# Patient Record
Sex: Male | Born: 1945 | Race: White | Hispanic: No | Marital: Married | Smoking: Former smoker
Health system: Southern US, Community
[De-identification: ages and names within clinical notes are randomized; demographics above are authoritative.]

## PROBLEM LIST (undated history)

## (undated) DIAGNOSIS — G4733 Obstructive sleep apnea (adult) (pediatric): Secondary | ICD-10-CM

## (undated) DIAGNOSIS — E1165 Type 2 diabetes mellitus with hyperglycemia: Secondary | ICD-10-CM

## (undated) DIAGNOSIS — E119 Type 2 diabetes mellitus without complications: Secondary | ICD-10-CM

## (undated) DIAGNOSIS — I480 Paroxysmal atrial fibrillation: Secondary | ICD-10-CM

## (undated) DIAGNOSIS — I2510 Atherosclerotic heart disease of native coronary artery without angina pectoris: Secondary | ICD-10-CM

## (undated) DIAGNOSIS — I10 Essential (primary) hypertension: Secondary | ICD-10-CM

## (undated) DIAGNOSIS — E1140 Type 2 diabetes mellitus with diabetic neuropathy, unspecified: Secondary | ICD-10-CM

## (undated) DIAGNOSIS — E785 Hyperlipidemia, unspecified: Secondary | ICD-10-CM

## (undated) DIAGNOSIS — I25810 Atherosclerosis of coronary artery bypass graft(s) without angina pectoris: Secondary | ICD-10-CM

## (undated) DIAGNOSIS — I499 Cardiac arrhythmia, unspecified: Secondary | ICD-10-CM

## (undated) DIAGNOSIS — K635 Polyp of colon: Secondary | ICD-10-CM

## (undated) HISTORY — DX: Type 2 diabetes mellitus without complications: E11.9

## (undated) HISTORY — DX: Essential (primary) hypertension: I10

## (undated) HISTORY — DX: Cardiac arrhythmia, unspecified: I49.9

## (undated) HISTORY — DX: Hyperlipidemia, unspecified: E78.5

## (undated) HISTORY — DX: Type 2 diabetes mellitus with diabetic neuropathy, unspecified: E11.40

## (undated) HISTORY — DX: Paroxysmal atrial fibrillation: I48.0

## (undated) HISTORY — DX: Polyp of colon: K63.5

## (undated) HISTORY — DX: Atherosclerosis of coronary artery bypass graft(s) without angina pectoris: I25.810

## (undated) HISTORY — DX: Morbid (severe) obesity due to excess calories: E66.01

## (undated) HISTORY — DX: Atherosclerotic heart disease of native coronary artery without angina pectoris: I25.10

## (undated) HISTORY — DX: Type 2 diabetes mellitus with hyperglycemia: E11.65

## (undated) HISTORY — DX: Obstructive sleep apnea (adult) (pediatric): G47.33

---

## 2000-04-07 ENCOUNTER — Encounter: Admission: RE | Admit: 2000-04-07 | Discharge: 2000-07-06 | Payer: Self-pay | Admitting: Internal Medicine

## 2000-05-25 ENCOUNTER — Inpatient Hospital Stay (HOSPITAL_COMMUNITY): Admission: EM | Admit: 2000-05-25 | Discharge: 2000-06-03 | Payer: Self-pay | Admitting: Emergency Medicine

## 2000-05-25 ENCOUNTER — Encounter: Payer: Self-pay | Admitting: Emergency Medicine

## 2000-05-27 ENCOUNTER — Encounter: Payer: Self-pay | Admitting: Interventional Cardiology

## 2000-05-28 ENCOUNTER — Encounter: Payer: Self-pay | Admitting: Cardiothoracic Surgery

## 2000-05-29 ENCOUNTER — Encounter: Payer: Self-pay | Admitting: Cardiothoracic Surgery

## 2000-05-30 ENCOUNTER — Encounter: Payer: Self-pay | Admitting: Cardiothoracic Surgery

## 2000-05-31 ENCOUNTER — Encounter: Payer: Self-pay | Admitting: Cardiothoracic Surgery

## 2000-08-06 ENCOUNTER — Inpatient Hospital Stay (HOSPITAL_COMMUNITY): Admission: AD | Admit: 2000-08-06 | Discharge: 2000-08-08 | Payer: Self-pay | Admitting: Interventional Cardiology

## 2002-04-08 ENCOUNTER — Ambulatory Visit (HOSPITAL_COMMUNITY): Admission: RE | Admit: 2002-04-08 | Discharge: 2002-04-08 | Payer: Self-pay | Admitting: Gastroenterology

## 2002-04-08 ENCOUNTER — Encounter (INDEPENDENT_AMBULATORY_CARE_PROVIDER_SITE_OTHER): Payer: Self-pay | Admitting: Specialist

## 2003-05-15 ENCOUNTER — Encounter: Admission: RE | Admit: 2003-05-15 | Discharge: 2003-05-15 | Payer: Self-pay | Admitting: Internal Medicine

## 2003-06-13 ENCOUNTER — Ambulatory Visit (HOSPITAL_COMMUNITY): Admission: RE | Admit: 2003-06-13 | Discharge: 2003-06-14 | Payer: Self-pay | Admitting: Neurosurgery

## 2003-07-28 ENCOUNTER — Encounter: Admission: RE | Admit: 2003-07-28 | Discharge: 2003-07-28 | Payer: Self-pay | Admitting: Neurosurgery

## 2009-11-02 ENCOUNTER — Ambulatory Visit (HOSPITAL_COMMUNITY): Admission: RE | Admit: 2009-11-02 | Discharge: 2009-11-02 | Payer: Self-pay | Admitting: Gastroenterology

## 2010-04-01 ENCOUNTER — Encounter: Payer: Self-pay | Admitting: Internal Medicine

## 2010-07-27 NOTE — H&P (Signed)
[place]. [facility]  Patient:    [name], [name]                       MRN: [id number] Adm. Date:  05/25/00 Attending:  Darci Needle, M.D. CC:         Winn Jock. Earl Gala, M.D.   History and Physical  INDICATION FOR ADMISSION:  Left shoulder discomfort, suspicion of unstable angina.  SUBJECTIVE:  Mr. Tignor is 65 years of age and has a history of coronary artery disease.  He suffered a myocardial infarction on his inferior wall in 1995 and underwent right coronary stent placement on November 09, 1993.  He has been asymptomatic since that time.  His presentation at that time was left shoulder discomfort.  Starting last evening, he began experiencing left shoulder discomfort and arm discomfort and a very similar distribution of that associated with his acute event in 1995.  He tried Tylenol last night.  Discomfort was mild, but persistent pretty much through the night.  This morning he took one sublingual nitroglycerin and discomfort abated within 10 minutes of the tablet melting. I instructed him to come to the emergency room.  There were no associated symptoms.  He specifically denies nausea, vomiting, diaphoresis, and chest discomfort.  He has had no dyspnea.  PAST MEDICAL HISTORY: 1. Inferior infarction.  Right coronary stent September 1995. 2. Hypertension greater than 15 years. 3. Diabetes mellitus greater than 10 years.  HABITS:  Negative tobacco use.  Discontinued smoking in 1995.  Positive ETOH socially.  He does not drink on a daily basis.  MEDICATIONS: 1. _________ 500 mg two tablets b.i.d. 2. Avandia 8 mg per day. 3. Metoprolol 25 mg b.i.d. 4. Zocor 20 mg per day. 5. Altace 5 mg per day. 6. Aspirin 325 mg per day.  ALLERGIES:  MORPHINE SULFATE causes skin rash.  FAMILY HISTORY:  Father is alive, age 61, has dementia and diabetes.  Mother died of myocardial infarction.  He has an older brother who is 18 and has had coronary  bypass surgery.  REVIEW OF SYSTEMS:  No GU, GI symptoms.  No neurological symptoms.  Denies skin rash.  Denies headache.  Does not have back discomfort.  No hemoptysis, coughing, or dyspnea.  Denies lower extremity swelling.  PHYSICAL EXAMINATION:  GENERAL:  On exam patient is in no distress.  SKIN:  Warm and dry.  No nail bed cyanosis was noted.  VITAL SIGNS:  Respirations are 16 and nonlabored.  Heart rate is 74, blood pressure is 124/66.  HEENT:  Pupils are equal and reactive.  No xanthelasma.  Extraocular movements are full.  NECK:  No JVD, carotid bruits, thyromegaly, or adenopathy.  CHEST:  Clear to auscultation and percussion.  CARDIAC:  No clicks, rubs, murmurs, or gallops.  ABDOMEN:  Soft.  Liver and spleen are not palpable.  Bowel sounds are normal.  EXTREMITIES:  Reveal no edema.  Femoral pulses are 2+ and symmetric.  Dorsalis pedis is 2+ bilaterally.  NEUROLOGIC:  Does not reveal any motor deficits.  Cranial nerves intact.  LABORATORY:  EKG reveals normal sinus rhythm, inferior MI, old.  No acute ST/T change.  Chest x-ray:  Cardiac enlargement with no CHF.  Labs reveal hemoglobin of 13.2.  CK-MB and troponin I are pending.  I stat reveals potassium 4.1, creatinine 0.8.  Hemoglobin 14.  IMPRESSION: 72. A 65 year old gentleman admitted with left shoulder discomfort relieved    with sublingual nitroglycerin.  This  is the same presentation as he had in    1995 when he had an inferior infarction and required right coronary stent.    He is admitted to rule out myocardial infarction.  He is suspected of    having an acute coronary syndrome. 2. Diabetes mellitus. 3. Hypertension.  PLAN: 1. Admit. 2. Subcu Lovenox. 3. IV nitroglycerin. 4. Aspirin and Plavix. 5. Coronary angiography. DD:  05/25/00 TD:  05/25/00 Job: [id number]

## 2010-07-27 NOTE — Cardiovascular Report (Signed)
[place]. [facility]  Patient:    [name], [name]                     MRN: [id number] Proc. Date: 05/26/00 Adm. Date:  [id number] Attending:  Molpus, Carlisle Beers CC:         Theressa Millard, M.D.  CVTS   Cardiac Catheterization  INDICATION FOR PROCEDURE:  Recent chest discomfort and trace positive cardiac markers for myocardial injury qualifying the patient as an acute coronary syndrome.  He has a prior history of inferior infarction with coronary stent in the mid RCA, November 09, 1993.  PROCEDURE PERFORMED: 1. Left heart catheterization. 2. Selective coronary angiogram. 3. Left ventriculography.  CARDIOLOGIST:  Darci Needle III, M.D.  DESCRIPTION OF PROCEDURE:  After informed consent a 6 French sheath was inserted into the right femoral artery using the modified Seldinger technique.  A 6 French A-2 multipurpose catheter was used for hemodynamic recordings and left ventriculography by hand injection in both the RAO, 30-degree cranial and 45-degree LAO.  Selective coronary angiography was performed using this catheter as well.  The patient tolerated the procedure without complications.  RESULTS: I - HEMODYNAMIC DATA: a) Aortic pressure 120/82. b) Left ventricular pressure 120/16.  II - LEFT VENTRICULOGRAPHY:  The left ventricle was mildly dilated.  The anteroapical region is mildly hypokinetic.  The inferior wall is moderately hypokinetic.  No mitral regurgitation is noted.  EF is estimated to be in the 45-55% range.  III - SELECTIVE CORONARY ANGIOGRAPHY: a) Left Main Coronary:  Widely patent.  b) Left Anterior Coronary Descending:  LAD is a large vessel that is heavily calcified as noted by cine fluoroscopy.  Its territory is relatively small. Gives origin to a moderate-sized diagonal.  The distal LAD stops short of the left ventricular apex.  The proximal and mid portion of the LAD is severely and diffusely diseased with up to 80%  narrowing and there is 80-90% segmental narrowing in the first diagonal.  Both regions are heavily calcified.  c) Circumflex Artery:  The circumflex artery is large.  It gives origin to five obtuse marginal branches.  The first obtuse marginal rises very proximally and contains 70-80% ostial narrowing.  The second obtuse marginal is very large and bifurcates on the left lateral wall.  It contains a proximal eccentric 40% narrowing.  The proximal circumflex as it rises from the left main contains 30% narrowing.  The circumflex beyond the second obtuse marginal contains segmental 50-70% narrowing.  The third, fourth and fifth obtuse marginal branches are free of significant obstruction.  d) Right Coronary:  The right coronary artery is the site of previous PCI. There is an eccentric 65-75% stenosis in the proximal portion of the mid vessel.  Between the two acute marginal branches there is an 80-90% stenosis. This is the region previously stented and therefore this does qualify as in-stent restenosis.  The distal RCA contains an eccentric 90% stenosis before a large PDA.  The PDA is large and wraps around the left ventricular apex. The continuation of the right coronary contains a 50% stenosis before it terminates on two relatively large left ventricular branches.  CONCLUSIONS: 1. Severe left anterior descending and right coronary disease.  The right coronary also includes in-stent restenosis.  The distal right coronary bed is very large and graftable.  The LAD territory is relatively small.  There is diffuse heavy calcification in the proximal and mid LAD, and the large first diagonal.  The distal vessels are probably easily graftable. The circumflex only contains moderate disease with the most severe area of narrowing occurring after the large second obtuse marginal branch.  The only distal branch that is graftable is the fourth obtuse marginal.  2. Left ventricular  dysfunction.  PLAN:  CVTS evaluation for consideration of surgery versus PCI with stenting and brachytherapy of the right coronary, and eventual rotational atherectomy of the LAD diagonal system. DD:  05/26/00 TD:  05/27/00 Job: [id number]

## 2010-07-27 NOTE — Op Note (Signed)
[place]. [facility]  Patient:    [name]                     MRN: [id number] Proc. Date: 05/28/00 Adm. Date:  [id number] Attending:  Mikey Bussing CC:         Celso Sickle, M.D., [facility]   Operative Report  PREOPERATIVE DIAGNOSES:  Class IV progressive angina with severe three-vessel coronary disease, non-Q-wave myocardial infarction.  POSTOPERATIVE DIAGNOSES:  Class IV progressive angina with severe three-vessel coronary disease, non-Q-wave myocardial infarction.  PROCEDURE:  Coronary artery bypass graft x 4 (left internal mammary artery o the left anterior descending coronary artery, left radial artery graft to posterior descending, saphenous vein graft to diagonal, saphenous vein graft to obtuse marginal 2).  SURGEON:  Mikey Bussing, M.D.  ASSISTANT:  Lissa Merlin, P.A.-C.  ANESTHESIA:  General by Burna Forts, M.D.  INDICATIONS:  The patient is a 65 year old diabetic with known coronary artery disease, who presents with acute-onset chest pain and was admitted and ruled in for non-Q-wave MI.  Cardiac catheterization by Dr. Verdis Prime demonstrated severe three-vessel coronary artery disease with a high-grade 95% stenosis of the right coronary artery, 80% calcific stenosis of the LAD, and a 70-80% stenosis of the OM2.  He was referred for surgical coronary revascularization. Prior to the operation, I examined the patient in his hospital room and reviewed the results of the cardiac catheterization with the patient and his family.  I discussed the indications and expected benefits of coronary bypass surgery.  I reviewed the choice of conduit to include mammary artery and left radial artery as well as saphenous vein.  I discussed the location of the surgical incisions, the use of general anesthesia, the use of cardiopulmonary bypass, and the expected hospital recovery period.  I discussed the alternatives to  surgery for his coronary artery disease.  I reviewed the risks associated with a coronary bypass operation with the patient, including the risks of MI, CVA, bleeding, infection, and death.  He understood these implications for surgery and agreed to proceed with the operation as planned under informed consent.  OPERATIVE FINDINGS:  The patients body habitus made exposure of the back of the heart difficult.  The OM2 was a 1.5 mm vessel and adequate quality for grafting.  The posterior descending was a large 1.8-2.0 mm vessel.  The second diagonal was a 1.5 mm vessel, and the LAD was a 1.8 mm vessel that did not fully reach the apex of the left ventricle.  The saphenous vein was harvested from the left leg and was of average quality.  DESCRIPTION OF PROCEDURE:  The patient was brought to the operating room and placed supine on the operating table, where general anesthesia was induced under invasive hemodynamic monitoring.  The chest, abdomen, and legs were prepped as well as the left arm and draped as a sterile field.  First the left radial artery was harvested as a free graft from the left arm.  Prior to removing the graft, a Doppler pulse was documented in the hand after occlusion of the proximal radial artery with a vascular clamp.  The incision was then closed, and the arm was partially brought back to the patients side.  At this point, the mammary artery was harvested following sternotomy, and saphenous vein was harvested from the left leg.  Heparin was administered, and the ACT was documented to be therapeutic.  Sternal retractor  was placed using the deep blade.  The pursestring was placed in the ascending aorta and right atrium. The patient was cannulated and placed on bypass.  The coronaries were identified, and the mammary artery, radial artery, and veins were prepared for the distal anastomoses.  A cardioplegia cannula was placed in the ascending aorta, and the patient was cooled  to 28 degrees.  As the aortic crossclamp was applied, 600 cc of cold blood cardioplegia was delivered to the aortic root with immediate cardioplegic arrest and septal temperature dropping to less than 12 degrees.  Topical ice saline slush was used to augment myocardial preservation, and a pericardial insulator pad was used to protect the left phrenic nerve.  The distal coronary anastomoses were then performed.  The first distal anastomosis was to the diagonal.  This was a 1.5 mm vessel with a proximal 90% stenosis, and a reversed saphenous vein was sewn end-to-side with a running 7-0 Prolene with good flow through the graft.  The second distal anastomosis was to the OM2.  This was a 1.5 mm vessel with proximal 80% stenosis, and a reversed saphenous vein was sewn end-to-side with a running 7-0 Prolene with good flow through the graft.  The third distal anastomosis was the left free radial artery graft to the posterior descending, which was a 2.0 mm vessel with proximal 95% stenosis.  An end-to-side anastomosis using running 8-0 Prolene was constructed, and there was good flow through the graft.  The fourth distal anastomosis was to the mid-LAD, which was a 1.8 mm vessel with proximal 80% stenosis.  The left internal mammary artery pedicle was brought through an opening created in the left lateral pericardium and was brought down on the LAD and sewn end-to-side with running 8-0 Prolene.  There was excellent flow through the anastomosis with immediate rise in septal temperature after release of the pedicle clamp on the mammary artery.  The mammary pedicle was secured to the epicardium, and the aortic crossclamp was removed.  The heart was cardioverted back to a regular rhythm.  A partial occlusion clamp was placed on the ascending aorta, and three proximal anastomoses were performed with the OM vein being superior and the radial artery graft being inferior on the aorta.  The partial clamp  was removed, and the grafts were perfused.  Each had excellent flow, and hemostasis was documented on the  proximal and distal sites.  The patient was rewarmed and reperfused, and temporary pacing wires were applied.  When the patient reached 37 degrees, the lungs were re-expanded and the ventilator was turned back on.  The patient was weaned from cardiopulmonary bypass without difficulty, without inotropes, and good hemodynamics and blood pressure.  Protamine was administered, and the cannulas were removed.  The mediastinum was irrigated with warm antibiotic irrigation.  The leg incision was irrigated and closed in the standard fashion.  The pericardium was loosely reapproximated superiorly over the aorta and grafts.  Two mediastinal and a left pleural chest tube were placed, brought out through separate incisions.  The sternum was reapproximated with interrupted steel wire.  The pectoralis fascia was closed with interrupted #1 Vicryl.  The subcutaneous layer was closed with a running 2-0 Vicryl.  Skin was closed with a subcuticular.  Sterile dressings were applied.  Total cardiopulmonary bypass time was 120 minutes with aortic crossclamp time of 55 minutes. DD:  05/28/00 TD:  05/29/00 Job: [id number]

## 2010-07-27 NOTE — Consult Note (Signed)
[place]. [facility]  Patient:    [name], [name]                     MRN: [id number] Proc. Date: 05/26/00 Adm. Date:  [id number] Attending:  Molpus, Carlisle Beers CC:         [facility]  CVTS office   Consultation Report  REQUESTING PHYSICIAN:  Darci Needle, M.D.  PRIMARY CARE PHYSICIAN:  Winn Jock. Earl Gala, M.D.  REASON FOR CONSULTATION:  Unstable angina with severe three vessel coronary disease.  CHIEF COMPLAINT:  Left shoulder pain.  HISTORY OF PRESENT ILLNESS:  I was asked to see Mr. Tai Skelly in consultation by Dr. Katrinka Blazing for evaluation of his severe three vessel coronary disease for possible surgical coronary revascularization.  The patient was admitted to the hospital through the emergency department on May 25, 2000, after developing left shoulder pain.  He described the pain as severe and pressure like and at first he thought it might be due to extensive yard work he did over the weekend.  The pain did not resolve with rest but he did take nitroglycerin with some relief and for that reason he then presented to the emergency department following the recommendation of Dr. Verdis Prime.  The patient had no associated shortness of breath, diaphoresis or nausea.  There is no neck or jaw pain.  The patient had been pain free since 1995 when he had a DMI and was treated with a right coronary stent by Dr. Verdis Prime.  He had been followed at regular intervals by Dr. Theressa Millard for his medical problems including hypertension and diabetes and had no interval cardiac symptoms since 1995.  He was admitted through the emergency department.  He had a mild rise in cardiac enzymes with a CPK-MB of 6.2.  He was stabilized on Lovenox and nitroglycerin.  He underwent cardiac catheterization today by Dr. Katrinka Blazing which showed a restenosis of the right coronary with a 90% distal RCA stenosis prior to the posterior descending and 70-80% stenosis to  the proximal LAD diagonal and 70% stenosis of the circumflex marginal with ejection fraction of 50% and left ventricular end-diastolic pressure measured at 16 mmHg.  Because of his type 2 diabetes and severe three vessel disease with progression over the past several years he was referred for surgical coronary revascularization.  PAST MEDICAL HISTORY: 1. Type 2 diabetes mellitus, on oral medications for 10 years. 2. Hypertension. 3. Known coronary artery disease. 4. Status post right lower leg injury from a chainsaw several years ago.  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  He has not smoked since 1995. He uses alcohol occasionally.  He lives with his wife in [place].  MEDICATIONS: 1. Avandia 8 mg q.d. 2. Glucovance 1 g p.o. b.i.d. 3. Metoprolol 25 mg b.i.d. 4. Zocor 20 mg q.d. 5. Altace 5 mg q.d. 6. Aspirin 325 mg q.d.  ALLERGIES:  Morphine and sulfate.  FAMILY HISTORY:  Brother age 49 had bypass surgery.  Mother who has died of an MI had previous bypass surgery and father age 31 is alive with diabetes.  REVIEW OF SYSTEMS:  He has lost 15 pounds in weight intentionally in order to help control his diabetes.  He denies any fever, productive cough or respiratory symptoms in the recent several days.  He denies any history of TIA or CVA.  He  denies any history of claudication of DVT.  He denies any history of free bleeding or easy bruisability.  He denies any history of thoracic trauma or broken ribs.  There is no change in bowel habits.  No chronic skin conditions.  Otherwise, review of systems is negative.  He is a right hand dominant individual.  PHYSICAL EXAMINATION:  VITAL SIGNS:  Height 5 feet 9 inches, weight 240 pounds.  Blood pressure is 140/70, heart rate 62 and regular and sinus rhythm.  Oxygen saturation is 94-95% on room air.  GENERAL:  Appearance is that of a very pleasant  middle-age white male in no distress in his hospital room following cardiac catheterization without chest pain.  He has just finished eating supper.  HEENT:  Normocephalic.  Full EOMs.  Pharynx clear.  Dentition under good repair.  NECK:  Supple without JVD, thyromegaly, mass or carotid bruits.  LUNGS:  Clear to auscultation without deformity.  CARDIAC:  Exam reveals regular rate and rhythm without S3 gallop or murmur.  ABDOMEN:  Soft, nontender without mass or abdominal bruit.  EXTREMITIES:  No swollen tender joints, clubbing or edema.  LYMPHATIC:  No palpable adenopathy in the cervical or supraclavicular regions.  VASCULAR:  Exam reveals 2+ pulses in both radials, both femorals and both pedal regions.  There is no evidence of chronic venous insufficiency.  SKIN:  Exam reveals chronic scarred areas in the right medial calf without active inflammation.  RECTAL:  Exam is deferred.  NEUROLOGICAL:  Alert and oriented x 3 with full motor function while he is limited to bed rest following cardiac catheterization.  LABORATORY DATA:  I reviewed the coronary angiograms with Dr. Verdis Prime.  He agreed that with three vessel disease and his underlying diabetes that surgical revascularization would provide him the best long term therapy for his coronary artery disease.  His BUN and creatinine prior to catheterization were normal, and his cardiac enzymes showed a very slight elevation.  IMPRESSION AND PLAN:  Severe three vessel coronary disease, class IV unstable angina, recent non-Q wave myocardial infarction.  We plan on coronary bypass grafting his left IMA to his LAD and vein grafts to the diagonal, circumflex marginal, posterior descending.  Surgery will be scheduled on Wednesday, May 28, 2000.  I have discussed the major aspects of the operation with the patient and his wife as well as the benefits and risks of the procedure. They  are in agreement to proceed with the  operation at this time.  I will return tomorrow to answer any further questions and to review the final plan for surgery on May 28, 2000.  Thank you very much for this consult. DD:  05/26/00 TD:  05/26/00 Job: [id number]

## 2010-07-27 NOTE — Discharge Summary (Signed)
[place]  Patient:    [name], [name]                     MRN: [id number] Adm. Date:  [id number] Disc. Date: [id number] Attending:  Kerin Perna Iii Dictator:   Myrlene Broker, P.A. CC:         Darci Needle, M.D.             [place]. Earl Gala, M.D.             CVTS office                           Discharge Summary  DATE OF BIRTH:  [birth year]  CARDIOLOGIST:  Dr. Verdis Prime, M.D., with [facility].  PRIMARY CARE PHYSICIAN:  Dr. Theressa Millard.  ADMISSION DIAGNOSIS:  Class IV unstable progressive angina with severe three-vessel coronary artery disease, non-Q-wave myocardial infarction.  SECONDARY DIAGNOSES/PREEXISTING CONDITIONS: 1. Inferior myocardial infarction, status post right stent placement in    September 1995. 2. Hypertension. 3. Diabetes mellitus. 4. Allergy to morphine, which causes a rash.  NEW DIAGNOSIS/DISCHARGE DIAGNOSES: 1. Postoperative anemia, which is asymptomatic and resolved. 2. Status post coronary artery bypass graft surgery.  PROCEDURES: 1. On May 26, 2000, the patient underwent cardiac catheterization. 2. On May 26, 2000, the patient had pre-coronary artery bypass graft surgery    Doppler evaluation. 3. On May 28, 2000, the patient had coronary artery bypass graft surgery    x 4, with the following grafts placed:  Left internal mammary artery to the    left anterior descending, saphenous vein graft to the diagonal branch 2,    saphenous vein graft to the obtuse marginal branch 2, and left radial    artery to the posterior descending artery.  HISTORY OF PRESENT ILLNESS AND HOSPITAL COURSE:  The patient is a 65 year old Caucasian male who had a history of coronary artery disease.  He had suffered a previous myocardial infarction of his inferior wall in 1995, and had a right coronary stent placement on November 09, 1993.  He began experiencing left shoulder discomfort and arm discomfort on  May 25, 2000, and was admitted under cardiologys service.  At that time a cardiac catheterization was performed which revealed severe three-vessel coronary artery disease. Dr. Kathlee Nations Trigt was consulted, and it was recommended that coronary artery bypass graft surgery would be the best option for this patient.  The patient underwent surgery on May 26, 2000, and the patient tolerated the procedure well.  His postoperative course was notable for some postoperative anemia for which the patient was asymptomatic and eventually improved.  The patient also had a low-grade fever, for which no etiology was determined.  The patients fever has resolved, and the patient will be sent home afebrile.  The patient did not have any cardiac complications, respiratory compromise, and his blood sugars remained stable throughout his admission.  The patient is anticipated for discharge today on June 03, 2000.  CONDITION ON DISCHARGE:  Stable and improved.  DISCHARGE MEDICATIONS: 1. Avandia 8 mg once daily. 2. Lasix 40 mg daily x 10 days. 3. Potassium chloride 20 mEq daily x 10 days. 4. Lopressor 25 mg b.i.d. 5. Altace 5 mg p.o. q.a.m. 6. Imdur 30 mg a day x 3 weeks for his left radial artery. 7. Enteric-coated aspirin 325 mg once daily. 8. Glucophage 500 mg 2 tablets b.i.d. 9.  Niferex 150 mg tablet b.i.d.  DISCHARGE INSTRUCTIONS:  He is instructed not to do any driving or lifting more than 10 pounds, to walk daily, and continue breathing exercises.  To follow a low fat, low sodium diet.  Told he could shower, clean the wounds with mild soap and water, and call the office if wound problems arise as noted on fact sheet.  FOLLOW-UP:  The patient is going to have a staple removal appointment in one week at the CVTS office, and the patient is to call Dr. Katrinka Blazing, his cardiologist, to make his follow-up appointment to see him in two weeks.  He will have his chest x-ray taken there and bring that chest  x-ray to his appointment with Dr. Kathlee Nations Trigt, who he will see in three weeks.  The patient is to be notified by our office of when that time will be.  The patient is also instructed to call Dr. Earl Gala for a follow-up appointment in approximately one month to six weeks. DD:  06/03/00 TD:  06/03/00 Job: [id number]

## 2010-07-27 NOTE — Op Note (Signed)
NAME:  SHAHIEM, BEDWELL                        ACCOUNT NO.:  [id number]   MEDICAL RECORD NO.:  [id number]                   PATIENT TYPE:  OIB   LOCATION:  2899                                 FACILITY:  [facility]   PHYSICIAN:  Kathaleen Maser. Pool, M.D.                 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   OPERATION PERFORMED:  C5-6 anterior cervical diskectomy and fusion with  allograft and anterior plating.   SURGEON:  Kathaleen Maser. Pool, M.D.   ASSISTANT:  Donalee Citrin, M.D.   ANESTHESIA:  General endotracheal.   INDICATIONS FOR PROCEDURE:  The patient is a 66 year old male with history  of neck and left upper extremity pain, paresthesias and weakness, consistent  with a left-sided C6 radiculopathy with elements of overlying cervical  myelopathy as well.  Work-ups demonstrated evidence of a significant  leftward C5-6 disk herniation with spinal cord compression and compression  of the exiting left-sided C6 nerve root.  The patient has been counseled as  to his options.  He has decided to proceed with C5-6 anterior cervical  diskectomy and fusion, allograft and anterior plating for hopeful  improvement of his symptoms.   DESCRIPTION OF PROCEDURE:  The patient was taken to the operating room and  placed on the table in supine position.  After adequate level of general  anesthesia was achieved, the patient was positioned supine with the neck  slightly extended and held in place with halter traction.  The patient's  anterior cervical region was prepped and draped sterilely.  A 10 blade was  used to make a linear skin incision overlying the C5-6 interspace.  This was  carried down sharply to the platysma.  The platysma was then divided  vertically and dissection proceeded along the medial border of the  sternocleidomastoid muscle and carotid sheath.  Trachea and esophagus were  mobilized and retracted toward the left.  Prevertebral fascia was stripped  off the anterior spinal column.  The longus colli muscles were then elevated  bilaterally using electrocautery.  Deep self-retaining retractor was placed.  Intraoperative fluoroscopy was used and the level was confirmed.  The disk  spaces were then incised with a 15 blade in rectangular fashion.  A wide  disk space cleanout was then achieved using pituitary rongeurs, forward and  backward angled Carlens curets, Kerrison rongeurs and a high speed drill.  All elements of the disk were removed down to the posterior annulus.  Microscope was brought into the field and used throughout the remainder of  the diskectomy.  The remaining aspects of the annulus and osteophytes were  removed using a high speed drill.  The posterior longitudinal ligament was  then elevated and resected in piecemeal fashion using Kerrison rongeurs.  The underlying thecal sac was identified.  A large amount of freely  herniated disk material was encountered off to the left side.  This was  removed using micro nerve hooks and Kerrison rongeurs.  A wide central  decompression was then performed by undercutting the bodies of C5 and C6.  Decompression proceeded out to each neural foramen.  A wide foraminotomy was  then performed along the course of the exiting C6 nerve root bilaterally.  A  blunt probe was passed easily both superiorly and inferiorly out each neural  foramen.  There was no evidence of any residual compression.  There was no  evidence of injury to the thecal sac and nerve roots.  The wound was then  irrigated with antibiotic solution.  Gelfoam was placed topically for  hemostasis which was found to be good.  An 8 mm patellar wedge allograft was  then impacted in place and recessed approximately 1  mm from the anterior  cortical margin.  A 25 mm Atlantis anterior cervical plate had been placed  over the C5 and C6 levels.  This was attached under fluoroscopic guidance  using 14 mm variable angle screws.  All four screws were found to be solidly  within bone, and given a final tightening.  Locking screws were engaged at  both levels.  Final images revealed good position of bone grafts and  hardware at the proper operative level with normal alignment of the spine.  The wound was then irrigated with antibiotic solution.  Gelfoam was placed  topically for hemostasis.  The wound was inspected for hemostasis one final  time and then closed in typical fashion.  Steri-Strips and sterile dressing  were applied.  There were no apparent complications.  The patient tolerated  the procedure well and returned to the recovery room postoperatively.                                               Henry A. Pool, M.D.    HAP/MEDQ  D:  06/13/2003  T:  06/14/2003  Job:  [id number]

## 2012-12-07 ENCOUNTER — Ambulatory Visit: Payer: Medicare Other | Attending: Geriatric Medicine | Admitting: Physical Therapy

## 2012-12-07 DIAGNOSIS — IMO0001 Reserved for inherently not codable concepts without codable children: Secondary | ICD-10-CM | POA: Insufficient documentation

## 2012-12-07 DIAGNOSIS — H8110 Benign paroxysmal vertigo, unspecified ear: Secondary | ICD-10-CM | POA: Insufficient documentation

## 2012-12-14 ENCOUNTER — Ambulatory Visit: Payer: Medicare Other | Attending: Geriatric Medicine | Admitting: Physical Therapy

## 2012-12-14 DIAGNOSIS — H8110 Benign paroxysmal vertigo, unspecified ear: Secondary | ICD-10-CM | POA: Insufficient documentation

## 2012-12-14 DIAGNOSIS — IMO0001 Reserved for inherently not codable concepts without codable children: Secondary | ICD-10-CM | POA: Insufficient documentation

## 2014-01-10 ENCOUNTER — Encounter: Payer: Self-pay | Admitting: Interventional Cardiology

## 2014-01-13 ENCOUNTER — Encounter: Payer: Self-pay | Admitting: Interventional Cardiology

## 2014-01-13 ENCOUNTER — Ambulatory Visit (INDEPENDENT_AMBULATORY_CARE_PROVIDER_SITE_OTHER): Payer: Medicare Other | Admitting: Interventional Cardiology

## 2014-01-13 ENCOUNTER — Other Ambulatory Visit (HOSPITAL_COMMUNITY): Payer: Medicare Other | Admitting: *Deleted

## 2014-01-13 ENCOUNTER — Ambulatory Visit (HOSPITAL_COMMUNITY): Payer: Medicare Other | Attending: Interventional Cardiology | Admitting: Radiology

## 2014-01-13 VITALS — BP 122/60 | HR 53 | Ht 69.0 in | Wt 234.0 lb

## 2014-01-13 DIAGNOSIS — I480 Paroxysmal atrial fibrillation: Secondary | ICD-10-CM

## 2014-01-13 DIAGNOSIS — I4892 Unspecified atrial flutter: Secondary | ICD-10-CM

## 2014-01-13 DIAGNOSIS — E119 Type 2 diabetes mellitus without complications: Secondary | ICD-10-CM | POA: Insufficient documentation

## 2014-01-13 DIAGNOSIS — E669 Obesity, unspecified: Secondary | ICD-10-CM | POA: Insufficient documentation

## 2014-01-13 DIAGNOSIS — E1140 Type 2 diabetes mellitus with diabetic neuropathy, unspecified: Secondary | ICD-10-CM

## 2014-01-13 DIAGNOSIS — IMO0002 Reserved for concepts with insufficient information to code with codable children: Secondary | ICD-10-CM

## 2014-01-13 DIAGNOSIS — E1165 Type 2 diabetes mellitus with hyperglycemia: Secondary | ICD-10-CM

## 2014-01-13 DIAGNOSIS — E785 Hyperlipidemia, unspecified: Secondary | ICD-10-CM | POA: Diagnosis not present

## 2014-01-13 DIAGNOSIS — G4719 Other hypersomnia: Secondary | ICD-10-CM

## 2014-01-13 DIAGNOSIS — I10 Essential (primary) hypertension: Secondary | ICD-10-CM

## 2014-01-13 DIAGNOSIS — R0609 Other forms of dyspnea: Principal | ICD-10-CM

## 2014-01-13 DIAGNOSIS — R251 Tremor, unspecified: Secondary | ICD-10-CM | POA: Insufficient documentation

## 2014-01-13 DIAGNOSIS — I25810 Atherosclerosis of coronary artery bypass graft(s) without angina pectoris: Secondary | ICD-10-CM

## 2014-01-13 HISTORY — DX: Morbid (severe) obesity due to excess calories: E66.01

## 2014-01-13 HISTORY — DX: Essential (primary) hypertension: I10

## 2014-01-13 HISTORY — DX: Reserved for concepts with insufficient information to code with codable children: IMO0002

## 2014-01-13 HISTORY — DX: Atherosclerosis of coronary artery bypass graft(s) without angina pectoris: I25.810

## 2014-01-13 HISTORY — DX: Paroxysmal atrial fibrillation: I48.0

## 2014-01-13 HISTORY — DX: Type 2 diabetes mellitus with diabetic neuropathy, unspecified: E11.40

## 2014-01-13 MED ORDER — PERFLUTREN LIPID MICROSPHERE
2.0000 mL | Freq: Once | INTRAVENOUS | Status: AC
  Administered 2014-01-13: 2 mL via INTRAVENOUS

## 2014-01-13 NOTE — Patient Instructions (Signed)
Your physician recommends that you continue on your current medications as directed. Please refer to the Current Medication list given to you today.  Your physician has requested that you have an echocardiogram. Echocardiography is a painless test that uses sound waves to create images of your heart. It provides your doctor with information about the size and shape of your heart and how well your heart's chambers and valves are working. This procedure takes approximately one hour. There are no restrictions for this procedure.   Your physician has recommended that you wear a holter monitor. Holter monitors are medical devices that record the heart's electrical activity. Doctors most often use these monitors to diagnose arrhythmias. Arrhythmias are problems with the speed or rhythm of the heartbeat. The monitor is a small, portable device. You can wear one while you do your normal daily activities. This is usually used to diagnose what is causing palpitations/syncope (passing out).   Your physician recommends that you schedule a follow-up appointment in December 2015

## 2014-01-13 NOTE — Progress Notes (Signed)
Patient ID: [name], male   DOB: January 16, 1946, 68 y.o.   MRN: [id number]     [address] [place], [place]  [address] Phone: [phone] Fax:  [phone] Date:  01/13/2014   ID:  [name], DOB January 16, 1946, MRN [id number]  PCP:  No primary care provider on file.   ASSESSMENT:  1. New, atrial flutter with controlled rate, unknown duration 2. Dyspnea on exertion and orthopnea, compatible with acute on chronic diastolic heart failure 3. Coronary artery disease, without angina 4. Hypertension, controlled 5. Fatigue, excessive daytime sleepiness, snoring, frequent awakening from sleep all compatible with undiagnosed sleep apnea  PLAN:  1. 2-D Doppler echocardiogram 2. 48-hour Holter monitor 3. Continue anticoagulation therapy 4. Clinical follow-up in 3-4 weeks 5. Obtain laboratory data recently done by Dr. Eula ListenHussain. BNP is high and basic metabolic panel is compatible, we may need to intensify diuretic regimen by switching HCTZ to furosemide   SUBJECTIVE: [name] is a 68 y.o. male who gives a 6-12 month history of fatigue, dyspnea on exertion, orthopnea, excessive daytime sleepiness, and lower extremity swelling. He denies chest pain. He saw Dr. Eula ListenHussain who did an evaluation and have him follow-up here because of concerns about his heart. EKG demonstrated atrial flutter. He has not had chest pain. He has not had syncope. No blood in his urine or stool. No recent diagnosis of anemia.   Wt Readings from Last 3 Encounters:  01/13/14 234 lb (106.142 kg)     Past Medical History  Diagnosis Date  . Diabetes mellitus without complication     type 2   . Hyperlipidemia   . Hypertension   . Arrhythmia     paroxysmal benign(monitoring, PT)  . Colon polyp     2004, 2007, 2011-repat in 2016   . CAD (coronary artery disease)     (MI x 3).post CABG  . CAD (coronary artery disease)     [facility]  . PAF (paroxysmal atrial  fibrillation) 01/13/2014  . Coronary artery disease involving other coronary artery bypass graft without angina pectoris 01/13/2014  . Essential hypertension 01/13/2014  . Type 2 diabetes, uncontrolled, with neuropathy 01/13/2014  . Morbid obesity 01/13/2014    Current Outpatient Prescriptions  Medication Sig Dispense Refill  . amLODipine (NORVASC) 10 MG tablet Take 10 mg by mouth daily.    . hydrochlorothiazide (HYDRODIURIL) 25 MG tablet Take 25 mg by mouth daily.    Marland Kitchen. lisinopril (PRINIVIL,ZESTRIL) 40 MG tablet Take 40 mg by mouth daily.    . metFORMIN (GLUCOPHAGE) 1000 MG tablet Take 1,000 mg by mouth 2 (two) times daily with a meal.    . simvastatin (ZOCOR) 20 MG tablet Take 20 mg by mouth daily. Take half a tablet daily.    . sotalol (BETAPACE) 120 MG tablet Take 120 mg by mouth daily.    . Vitamin D, Ergocalciferol, (DRISDOL) 50000 UNITS CAPS capsule Take 50,000 Units by mouth every 7 (seven) days.    Marland Kitchen. warfarin (COUMADIN) 5 MG tablet Take 5 mg by mouth daily. Take 1.5 tablet on Mondays and Wednesday and one tablet on all other days.     No current facility-administered medications for this visit.    Allergies:    Allergies  Allergen Reactions  . Morphine And Related     Social History:   Does not smoke or drink.  ROS:  Please see the history of present illness.   No transient neurological complaints.  Does have a tremor right hand. Denies syncope. No ascites/abdominal swelling. Denies wheezing and cough. Appetite is been stable. He sleeps a lot during the day.   All other systems reviewed and negative.   OBJECTIVE: VS:  BP 122/60 mmHg  Pulse 53  Ht 5\' 9"  (1.753 m)  Wt 234 lb (106.142 kg)  BMI 34.54 kg/m2 Well nourished, well developed, in no acute distress, obese HEENT: normal Neck: JVD moderate elevation with the patient lying at 30. Carotid bruit absent  Cardiac:  normal S1, S2; RRR; no murmur Lungs:  clear to auscultation bilaterally, no wheezing, rhonchi or rales Abd:  soft, nontender, no hepatomegaly Ext: Edema trace bilateral lower extremity edema. Pulses 2+ Skin: warm and dry Neuro:  CNs 2-12 intact, no focal abnormalities noted  EKG:  Atrial flutter with rate of 53 bpm with evidence of old anterior infarction and left axis deviation.       Signed, Darci NeedleHenry W. B. Algernon Mundie III, MD 01/13/2014 12:06 PM

## 2014-01-13 NOTE — Progress Notes (Signed)
Echocardiogram performed.  

## 2014-01-14 ENCOUNTER — Telehealth: Payer: Self-pay | Admitting: Interventional Cardiology

## 2014-01-14 ENCOUNTER — Telehealth: Payer: Self-pay

## 2014-01-14 DIAGNOSIS — I10 Essential (primary) hypertension: Secondary | ICD-10-CM

## 2014-01-14 MED ORDER — FUROSEMIDE 40 MG PO TABS: 40.0000 mg | ORAL_TABLET | Freq: Every day | ORAL | Status: DC

## 2014-01-14 NOTE — Telephone Encounter (Signed)
-----   Message from Lyn RecordsHenry W Smith III, MD sent at 01/14/2014  1:40 PM EST ----- Echo shows that his heart is weak. Discontinue hydrochlorothiazide, and start furosemide 40 mg daily. Check basic metabolic panel 7 days after making change

## 2014-01-14 NOTE — Telephone Encounter (Signed)
New message     Please leave new presc for furosemide at desk on Monday and they will come by and pick it up.  [facility] and they will need to know this. Also call in presc to local pharmacy as discussed.

## 2014-01-14 NOTE — Telephone Encounter (Signed)
-----   Message from Henry W Smith III, MD sent at 01/14/2014  1:40 PM EST ----- Echo shows that his heart is weak. Discontinue hydrochlorothiazide, and start furosemide 40 mg daily. Check basic metabolic panel 7 days after making change 

## 2014-01-14 NOTE — Telephone Encounter (Signed)
Pt aware of echo results and Dr.Smith instructions. Echo shows that his heart is weak. Discontinue hydrochlorothiazide, and start furosemide 40 mg daily.    Check basic metabolic panel 7 days after making change.Rx sent to pt pharmacy Lab appt sch for 11/11. Pt verbalized understanding.

## 2014-01-17 ENCOUNTER — Other Ambulatory Visit: Payer: Self-pay

## 2014-01-17 MED ORDER — FUROSEMIDE 40 MG PO TABS: 40.0000 mg | ORAL_TABLET | Freq: Every day | ORAL | Status: DC

## 2014-01-19 ENCOUNTER — Other Ambulatory Visit (INDEPENDENT_AMBULATORY_CARE_PROVIDER_SITE_OTHER): Payer: Medicare Other | Admitting: *Deleted

## 2014-01-19 ENCOUNTER — Encounter (INDEPENDENT_AMBULATORY_CARE_PROVIDER_SITE_OTHER): Payer: Medicare Other

## 2014-01-19 ENCOUNTER — Encounter: Payer: Self-pay | Admitting: *Deleted

## 2014-01-19 ENCOUNTER — Other Ambulatory Visit: Payer: Self-pay | Admitting: Cardiovascular Disease

## 2014-01-19 DIAGNOSIS — I10 Essential (primary) hypertension: Secondary | ICD-10-CM

## 2014-01-19 DIAGNOSIS — I4892 Unspecified atrial flutter: Secondary | ICD-10-CM

## 2014-01-19 LAB — BASIC METABOLIC PANEL
BUN: 23 mg/dL (ref 6–23)
CALCIUM: 9.1 mg/dL (ref 8.4–10.5)
CHLORIDE: 104 meq/L (ref 96–112)
CO2: 30 meq/L (ref 19–32)
CREATININE: 1.1 mg/dL (ref 0.4–1.5)
GFR: 69.57 mL/min (ref >60.00)
GLUCOSE: 110 mg/dL — AB (ref 70–99)
POTASSIUM: 4.2 meq/L (ref 3.5–5.1)
Sodium: 144 mEq/L (ref 135–145)

## 2014-01-19 MED ORDER — FUROSEMIDE 40 MG PO TABS: 40.0000 mg | ORAL_TABLET | Freq: Every day | ORAL | Status: AC

## 2014-01-19 NOTE — Progress Notes (Signed)
Patient ID: [name], male   DOB: October 02, 1945, 68 y.o.   MRN: [id number] [facility] 48 hour holter monitor applied to patient.

## 2014-01-24 ENCOUNTER — Telehealth: Payer: Self-pay

## 2014-01-24 NOTE — Telephone Encounter (Signed)
Returned call to DIRECTVDonn @ [facility]. Adv her that we have not received pt holter via fax. Lupita LeashDonna will  Refax now

## 2014-01-24 NOTE — Telephone Encounter (Signed)
New message  Lupita LeashDonna with [facility] called..reports she has faxed over Holter results for this patient via email. Pt is in Afib Flitter. Please call back to discuss..Marland Kitchen

## 2014-01-24 NOTE — Telephone Encounter (Signed)
Pt aware of holter monitor results and Dr.Smith recommendations. -Aflutter Pt adv to d/c Sotalol pt has been complaining of fatigue. Pt adv that Dr.Smith thinks that will improve after stopping sotalol Pt appt with Dr.Smith has been moved up to 02/14/14 @4 :15pm

## 2014-02-10 ENCOUNTER — Encounter: Payer: Self-pay | Admitting: *Deleted

## 2014-02-14 ENCOUNTER — Ambulatory Visit: Payer: Medicare Other | Admitting: Physician Assistant

## 2014-02-14 ENCOUNTER — Encounter: Payer: Self-pay | Admitting: Interventional Cardiology

## 2014-02-14 ENCOUNTER — Ambulatory Visit (INDEPENDENT_AMBULATORY_CARE_PROVIDER_SITE_OTHER): Payer: Medicare Other | Admitting: Interventional Cardiology

## 2014-02-14 VITALS — BP 136/60 | HR 71 | Ht 69.0 in | Wt 231.0 lb

## 2014-02-14 DIAGNOSIS — R001 Bradycardia, unspecified: Secondary | ICD-10-CM

## 2014-02-14 DIAGNOSIS — R0683 Snoring: Secondary | ICD-10-CM

## 2014-02-14 DIAGNOSIS — I5022 Chronic systolic (congestive) heart failure: Secondary | ICD-10-CM | POA: Insufficient documentation

## 2014-02-14 DIAGNOSIS — I10 Essential (primary) hypertension: Secondary | ICD-10-CM

## 2014-02-14 DIAGNOSIS — I25810 Atherosclerosis of coronary artery bypass graft(s) without angina pectoris: Secondary | ICD-10-CM

## 2014-02-14 DIAGNOSIS — I480 Paroxysmal atrial fibrillation: Secondary | ICD-10-CM

## 2014-02-14 DIAGNOSIS — I5023 Acute on chronic systolic (congestive) heart failure: Secondary | ICD-10-CM

## 2014-02-14 NOTE — Progress Notes (Signed)
Patient ID: [name], male   DOB: 17-Dec-1945, 68 y.o.   MRN: [id number]    [address] [place], [place]  [address] Phone: [phone] Fax:  [phone]) [phone]  Date:  02/14/2014   ID:  [name], DOB 17-Dec-1945, MRN [id number]  PCP:  No primary care provider on file.   ASSESSMENT:  1.  Acute on chronic systolic heart failure, improved with loop diuretic, furosemide  2. Persistent/chronic atrial fibrillation of unknown duration. Had previously been on sotalol for A. Fib suppression. Medication was discontinued after A. Fib was documented to be persistent  3. Heart artery disease with prior coronary bypass grafting rated than 10 years ago  4. Hypertension, stable  PLAN:  1.  Sleep study  2. Left and right heart catheterization with coronary angiography and bypass graft angiography  3. Hold Coumadin for 5 days prior to the procedure  4. If bypass graft failure is not the cause of decrease in LV function, we will return out attention towards the possibility of atrial fibrillation related decrease in LV function although we have not documented any tachycardia  5. All findings of prior studies and rationale for ongoing workup were discussed in detail with the patient and wife. I don't feel that there is an urgency to perform angiography in absence of angina and with improvement in the current clinical condition.   SUBJECTIVE: [name] is a 68 y.o. male  Who underwent coronary artery bypass grafting many years ago. He's had no angina or chest discomfort to speak of. He came for clinical evaluation in November and was found to be in systolic heart failure. New reduction in LVEF to 30% by echo. He is to receive improvement in dyspnea on exertion and orthopnea from switching hydrochlorothiazide to furosemide. He is able to ambulate without as much limitation. Sotalol was discontinued after the Holter demonstrated continuous atrial fibrillation and heart rates at times  as low as 30 bpm. He denies palpitations and tachycardia since sotalol was discontinued. The echocardiogram also demonstrated moderate elevation in pulmonary artery pressures to 42 mmHg systolic.  Wt Readings from Last 3 Encounters:  02/14/14 231 lb (104.781 kg)  01/13/14 234 lb (106.142 kg)     Past Medical History  Diagnosis Date  . Diabetes mellitus without complication     type 2   . Hyperlipidemia   . Hypertension   . Arrhythmia     paroxysmal benign(monitoring, PT)  . Colon polyp     2004, 2007, 2011-repat in 2016   . CAD (coronary artery disease)     (MI x 3).post CABG  . CAD (coronary artery disease)     [facility]  . PAF (paroxysmal atrial fibrillation) 01/13/2014  . Coronary artery disease involving other coronary artery bypass graft without angina pectoris 01/13/2014  . Essential hypertension 01/13/2014  . Type 2 diabetes, uncontrolled, with neuropathy 01/13/2014  . Morbid obesity 01/13/2014    Current Outpatient Prescriptions  Medication Sig Dispense Refill  . amLODipine (NORVASC) 10 MG tablet Take 10 mg by mouth daily.    . furosemide (LASIX) 40 MG tablet Take 1 tablet (40 mg total) by mouth daily. 30 tablet 11  . lisinopril (PRINIVIL,ZESTRIL) 40 MG tablet Take 40 mg by mouth daily.    . metFORMIN (GLUCOPHAGE) 1000 MG tablet Take 1,000 mg by mouth 2 (two) times daily with a meal.    . simvastatin (ZOCOR) 20 MG tablet Take 20 mg by mouth  daily. Take half a tablet daily.    . Vitamin D, Ergocalciferol, (DRISDOL) 50000 UNITS CAPS capsule Take 50,000 Units by mouth every 7 (seven) days.    Marland Kitchen. warfarin (COUMADIN) 5 MG tablet Take 5 mg by mouth daily. Take 1.5 tablet on Mondays and Wednesday and one tablet on all other days.     No current facility-administered medications for this visit.    Allergies:    Allergies  Allergen Reactions  . Morphine And Related     Social History:  The patient  reports that he has quit smoking. He does not  have any smokeless tobacco history on file. He reports that he does not use illicit drugs.   ROS:  Please see the history of present illness.   Exertional tolerance has improved. Appetite is improved. He is able to lie down without dyspnea. He denies chest pain. No palpitations. No neurological complaints.   All other systems reviewed and negative.   OBJECTIVE: VS:  BP 136/60 mmHg  Pulse 71  Ht 5\' 9"  (1.753 m)  Wt 231 lb (104.781 kg)  BMI 34.10 kg/m2 Well nourished, well developed, in no acute distress, obese HEENT: normal Neck: JVD flat. Carotid bruit absent  Cardiac:  normal S1, S2; RRR; no murmur Lungs:  clear to auscultation bilaterally, no wheezing, rhonchi or rales Abd: soft, nontender, no hepatomegaly Ext: Edema none. Pulses 2+ and symmetric Skin: warm and dry Neuro:  CNs 2-12 intact, no focal abnormalities noted  EKG:   Atrial fibrillation with rate of 71 bpm.       Signed, Darci NeedleHenry W. B. Smith III, MD 02/14/2014 4:22 PM

## 2014-02-14 NOTE — Patient Instructions (Signed)
Your physician recommends that you continue on your current medications as directed. Please refer to the Current Medication list given to you today.  Your physician has recommended that you have a sleep study. This test records several body functions during sleep, including: brain activity, eye movement, oxygen and carbon dioxide blood levels, heart rate and rhythm, breathing rate and rhythm, the flow of air through your mouth and nose, snoring, body muscle movements, and chest and belly movement.  Your physician has requested that you have a cardiac catheterization. Cardiac catheterization is used to diagnose and/or treat various heart conditions. Doctors may recommend this procedure for a number of different reasons. The most common reason is to evaluate chest pain. Chest pain can be a symptom of coronary artery disease (CAD), and cardiac catheterization can show whether plaque is narrowing or blocking your heart's arteries. This procedure is also used to evaluate the valves, as well as measure the blood flow and oxygen levels in different parts of your heart. For further information please visit [email or web address]. Please follow instruction sheet, as given. ( We will call you to schedule)

## 2014-02-15 ENCOUNTER — Telehealth: Payer: Self-pay | Admitting: Interventional Cardiology

## 2014-02-15 DIAGNOSIS — Z7901 Long term (current) use of anticoagulants: Secondary | ICD-10-CM

## 2014-02-15 DIAGNOSIS — Z01812 Encounter for preprocedural laboratory examination: Secondary | ICD-10-CM

## 2014-02-15 NOTE — Telephone Encounter (Signed)
New message         Pt currently has a rash on his upper inner right thigh / it has been treated and is clearing up / she wanted you to know

## 2014-02-17 NOTE — Telephone Encounter (Signed)
Pt wife aware/ pt cath is scheduled on 12/17 @ 7:30am with Dr.Smith [facility] Pt will [facility] preprocedure lab [facility]. Pt  last dose of coumadin will be on 12/11.pt will hold Metformin the day before and day of procedure.all preprocedure instruction reviewed verbally, written instructions mailed. Pt wife verbalized understanding.

## 2014-02-18 ENCOUNTER — Ambulatory Visit (INDEPENDENT_AMBULATORY_CARE_PROVIDER_SITE_OTHER)
Admission: RE | Admit: 2014-02-18 | Discharge: 2014-02-18 | Disposition: A | Discharge status: Home / Self Care | Payer: Medicare Other | Source: Ambulatory Visit | Attending: Interventional Cardiology | Admitting: Interventional Cardiology

## 2014-02-18 ENCOUNTER — Other Ambulatory Visit (INDEPENDENT_AMBULATORY_CARE_PROVIDER_SITE_OTHER): Payer: Medicare Other

## 2014-02-18 DIAGNOSIS — Z01812 Encounter for preprocedural laboratory examination: Secondary | ICD-10-CM

## 2014-02-18 DIAGNOSIS — Z7901 Long term (current) use of anticoagulants: Secondary | ICD-10-CM

## 2014-02-18 LAB — CBC WITH DIFFERENTIAL/PLATELET
BASOS ABS: 0.1 10*3/uL (ref 0.0–0.1)
Basophils Relative: 0.9 % (ref 0.0–3.0)
Eosinophils Absolute: 0.3 10*3/uL (ref 0.0–0.7)
Eosinophils Relative: 4.2 % (ref 0.0–5.0)
HEMATOCRIT: 40.3 % (ref 39.0–52.0)
Hemoglobin: 12.8 g/dL — ABNORMAL LOW (ref 13.0–17.0)
LYMPHS ABS: 2 10*3/uL (ref 0.7–4.0)
Lymphocytes Relative: 24.7 % (ref 12.0–46.0)
MCHC: 31.8 g/dL (ref 30.0–36.0)
MCV: 91.2 fl (ref 78.0–100.0)
MONO ABS: 0.8 10*3/uL (ref 0.1–1.0)
MONOS PCT: 9.8 % (ref 3.0–12.0)
NEUTROS ABS: 4.9 10*3/uL (ref 1.4–7.7)
Neutrophils Relative %: 60.4 % (ref 43.0–77.0)
Platelets: 198 10*3/uL (ref 150.0–400.0)
RBC: 4.42 Mil/uL (ref 4.22–5.81)
RDW: 14.3 % (ref 11.5–15.5)
WBC: 8.1 10*3/uL (ref 4.0–10.5)

## 2014-02-18 LAB — BASIC METABOLIC PANEL
BUN: 22 mg/dL (ref 6–23)
CALCIUM: 9.5 mg/dL (ref 8.4–10.5)
CHLORIDE: 104 meq/L (ref 96–112)
CO2: 28 meq/L (ref 19–32)
CREATININE: 1 mg/dL (ref 0.4–1.5)
GFR: 79.37 mL/min (ref >60.00)
Glucose, Bld: 158 mg/dL — ABNORMAL HIGH (ref 70–99)
POTASSIUM: 4.4 meq/L (ref 3.5–5.1)
Sodium: 139 mEq/L (ref 135–145)

## 2014-02-18 LAB — PROTIME-INR
INR: 2.3 ratio — AB (ref 0.8–1.0)
PROTHROMBIN TIME: 25.3 s — AB (ref 9.6–13.1)

## 2014-02-23 ENCOUNTER — Encounter: Payer: Self-pay | Admitting: General Practice

## 2014-02-24 ENCOUNTER — Ambulatory Visit (HOSPITAL_COMMUNITY)
Admission: RE | Admit: 2014-02-24 | Discharge: 2014-02-24 | Disposition: A | Discharge status: Home / Self Care | Payer: Medicare Other | Source: Ambulatory Visit | Attending: Interventional Cardiology | Admitting: Interventional Cardiology

## 2014-02-24 ENCOUNTER — Encounter (HOSPITAL_COMMUNITY): Payer: Self-pay | Admitting: Interventional Cardiology

## 2014-02-24 ENCOUNTER — Encounter (HOSPITAL_COMMUNITY)
Admission: RE | Disposition: A | Discharge status: Home / Self Care | Payer: Self-pay | Source: Ambulatory Visit | Attending: Interventional Cardiology

## 2014-02-24 DIAGNOSIS — I252 Old myocardial infarction: Secondary | ICD-10-CM | POA: Insufficient documentation

## 2014-02-24 DIAGNOSIS — I480 Paroxysmal atrial fibrillation: Secondary | ICD-10-CM | POA: Diagnosis present

## 2014-02-24 DIAGNOSIS — Z87891 Personal history of nicotine dependence: Secondary | ICD-10-CM | POA: Insufficient documentation

## 2014-02-24 DIAGNOSIS — Z6834 Body mass index (BMI) 34.0-34.9, adult: Secondary | ICD-10-CM | POA: Insufficient documentation

## 2014-02-24 DIAGNOSIS — Z951 Presence of aortocoronary bypass graft: Secondary | ICD-10-CM | POA: Insufficient documentation

## 2014-02-24 DIAGNOSIS — I4892 Unspecified atrial flutter: Secondary | ICD-10-CM | POA: Diagnosis present

## 2014-02-24 DIAGNOSIS — E785 Hyperlipidemia, unspecified: Secondary | ICD-10-CM | POA: Insufficient documentation

## 2014-02-24 DIAGNOSIS — I2510 Atherosclerotic heart disease of native coronary artery without angina pectoris: Secondary | ICD-10-CM

## 2014-02-24 DIAGNOSIS — I10 Essential (primary) hypertension: Secondary | ICD-10-CM | POA: Insufficient documentation

## 2014-02-24 DIAGNOSIS — I25810 Atherosclerosis of coronary artery bypass graft(s) without angina pectoris: Secondary | ICD-10-CM | POA: Diagnosis present

## 2014-02-24 DIAGNOSIS — I481 Persistent atrial fibrillation: Secondary | ICD-10-CM | POA: Insufficient documentation

## 2014-02-24 DIAGNOSIS — E1140 Type 2 diabetes mellitus with diabetic neuropathy, unspecified: Secondary | ICD-10-CM | POA: Insufficient documentation

## 2014-02-24 DIAGNOSIS — I5023 Acute on chronic systolic (congestive) heart failure: Secondary | ICD-10-CM | POA: Insufficient documentation

## 2014-02-24 DIAGNOSIS — I5022 Chronic systolic (congestive) heart failure: Secondary | ICD-10-CM | POA: Diagnosis present

## 2014-02-24 HISTORY — PX: LEFT AND RIGHT HEART CATHETERIZATION WITH CORONARY/GRAFT ANGIOGRAM: SHX5448

## 2014-02-24 LAB — GLUCOSE, CAPILLARY
GLUCOSE-CAPILLARY: 190 mg/dL — AB (ref 70–99)
GLUCOSE-CAPILLARY: 129 mg/dL — AB (ref 70–99)

## 2014-02-24 LAB — PROTIME-INR
INR: 1.02 (ref 0.00–1.49)
PROTHROMBIN TIME: 13.5 s (ref 11.6–15.2)

## 2014-02-24 SURGERY — LEFT AND RIGHT HEART CATHETERIZATION WITH CORONARY/GRAFT ANGIOGRAM
Anesthesia: LOCAL

## 2014-02-24 MED ORDER — ASPIRIN 81 MG PO CHEW
CHEWABLE_TABLET | ORAL | Status: AC
  Filled 2014-02-24: qty 1

## 2014-02-24 MED ORDER — SODIUM CHLORIDE 0.9 % IV SOLN: INTRAVENOUS | Status: AC

## 2014-02-24 MED ORDER — ASPIRIN 81 MG PO CHEW
81.0000 mg | CHEWABLE_TABLET | ORAL | Status: AC
  Administered 2014-02-24: 81 mg via ORAL

## 2014-02-24 MED ORDER — NITROGLYCERIN 1 MG/10 ML FOR IR/CATH LAB
INTRA_ARTERIAL | Status: AC
  Filled 2014-02-24: qty 10

## 2014-02-24 MED ORDER — HEPARIN (PORCINE) IN NACL 2-0.9 UNIT/ML-% IJ SOLN
INTRAMUSCULAR | Status: AC
  Filled 2014-02-24: qty 1500

## 2014-02-24 MED ORDER — ACETAMINOPHEN 325 MG PO TABS: 650.0000 mg | ORAL_TABLET | ORAL | Status: DC | PRN

## 2014-02-24 MED ORDER — CARVEDILOL 6.25 MG PO TABS
6.2500 mg | ORAL_TABLET | Freq: Two times a day (BID) | ORAL | Status: DC
  Filled 2014-02-24 (×2): qty 1

## 2014-02-24 MED ORDER — LIDOCAINE HCL (PF) 1 % IJ SOLN
INTRAMUSCULAR | Status: AC
  Filled 2014-02-24: qty 30

## 2014-02-24 MED ORDER — CARVEDILOL 6.25 MG PO TABS: 6.2500 mg | ORAL_TABLET | Freq: Two times a day (BID) | ORAL | Status: DC

## 2014-02-24 MED ORDER — SODIUM CHLORIDE 0.9 % IJ SOLN: 3.0000 mL | Freq: Two times a day (BID) | INTRAMUSCULAR | Status: DC

## 2014-02-24 MED ORDER — SODIUM CHLORIDE 0.9 % IV SOLN
INTRAVENOUS | Status: DC
  Administered 2014-02-24: 06:00:00 via INTRAVENOUS

## 2014-02-24 MED ORDER — MIDAZOLAM HCL 2 MG/2ML IJ SOLN
INTRAMUSCULAR | Status: AC
  Filled 2014-02-24: qty 2

## 2014-02-24 MED ORDER — SODIUM CHLORIDE 0.9 % IJ SOLN: 3.0000 mL | INTRAMUSCULAR | Status: DC | PRN

## 2014-02-24 MED ORDER — FENTANYL CITRATE 0.05 MG/ML IJ SOLN
INTRAMUSCULAR | Status: AC
  Filled 2014-02-24: qty 2

## 2014-02-24 MED ORDER — SODIUM CHLORIDE 0.9 % IV SOLN: 250.0000 mL | INTRAVENOUS | Status: DC | PRN

## 2014-02-24 MED ORDER — ONDANSETRON HCL 4 MG/2ML IJ SOLN: 4.0000 mg | Freq: Four times a day (QID) | INTRAMUSCULAR | Status: DC | PRN

## 2014-02-24 NOTE — Interval H&P Note (Signed)
Cath Lab Visit (complete for each Cath Lab visit)  Clinical Evaluation Leading to the Procedure:   ACS: No.  Non-ACS:    Anginal Classification: CCS III  Anti-ischemic medical therapy: Maximal Therapy (2 or more classes of medications)  Non-Invasive Test Results: No non-invasive testing performed  Prior CABG: Previous CABG      History and Physical Interval Note:  02/24/2014 7:00 AM  [name]  has presented today for surgery, with the diagnosis of reduced lv function  The various methods of treatment have been discussed with the patient and family. After consideration of risks, benefits and other options for treatment, the patient has consented to  Procedure(s): LEFT AND RIGHT HEART CATHETERIZATION WITH CORONARY/GRAFT ANGIOGRAM (N/A) as a surgical intervention .  The patient's history has been reviewed, patient examined, no change in status, stable for surgery.  I have reviewed the patient's chart and labs.  Questions were answered to the patient's satisfaction.     Lesleigh NoeSMITH III,HENRY W

## 2014-02-24 NOTE — H&P (View-Only) (Signed)
Patient ID: [name], male   DOB: 17-Dec-1945, 68 y.o.   MRN: [id number]    [address] [place], [place]  [address] Phone: [phone] Fax:  [phone]  Date:  02/14/2014   ID:  [name], DOB 17-Dec-1945, MRN [id number]  PCP:  No primary care provider on file.   ASSESSMENT:  1.  Acute on chronic systolic heart failure, improved with loop diuretic, furosemide  2. Persistent/chronic atrial fibrillation of unknown duration. Had previously been on sotalol for A. Fib suppression. Medication was discontinued after A. Fib was documented to be persistent  3. Heart artery disease with prior coronary bypass grafting rated than 10 years ago  4. Hypertension, stable  PLAN:  1.  Sleep study  2. Left and right heart catheterization with coronary angiography and bypass graft angiography  3. Hold Coumadin for 5 days prior to the procedure  4. If bypass graft failure is not the cause of decrease in LV function, we will return out attention towards the possibility of atrial fibrillation related decrease in LV function although we have not documented any tachycardia  5. All findings of prior studies and rationale for ongoing workup were discussed in detail with the patient and wife. I don't feel that there is an urgency to perform angiography in absence of angina and with improvement in the current clinical condition.   SUBJECTIVE: [name] is a 68 y.o. male  Who underwent coronary artery bypass grafting many years ago. He's had no angina or chest discomfort to speak of. He came for clinical evaluation in November and was found to be in systolic heart failure. New reduction in LVEF to 30% by echo. He is to receive improvement in dyspnea on exertion and orthopnea from switching hydrochlorothiazide to furosemide. He is able to ambulate without as much limitation. Sotalol was discontinued after the Holter demonstrated continuous atrial fibrillation and heart rates at times  as low as 30 bpm. He denies palpitations and tachycardia since sotalol was discontinued. The echocardiogram also demonstrated moderate elevation in pulmonary artery pressures to 42 mmHg systolic.  Wt Readings from Last 3 Encounters:  02/14/14 231 lb (104.781 kg)  01/13/14 234 lb (106.142 kg)     Past Medical History  Diagnosis Date  . Diabetes mellitus without complication     type 2   . Hyperlipidemia   . Hypertension   . Arrhythmia     paroxysmal benign(monitoring, PT)  . Colon polyp     2004, 2007, 2011-repat in 2016   . CAD (coronary artery disease)     (MI x 3).post CABG  . CAD (coronary artery disease)     [facility]  . PAF (paroxysmal atrial fibrillation) 01/13/2014  . Coronary artery disease involving other coronary artery bypass graft without angina pectoris 01/13/2014  . Essential hypertension 01/13/2014  . Type 2 diabetes, uncontrolled, with neuropathy 01/13/2014  . Morbid obesity 01/13/2014    Current Outpatient Prescriptions  Medication Sig Dispense Refill  . amLODipine (NORVASC) 10 MG tablet Take 10 mg by mouth daily.    . furosemide (LASIX) 40 MG tablet Take 1 tablet (40 mg total) by mouth daily. 30 tablet 11  . lisinopril (PRINIVIL,ZESTRIL) 40 MG tablet Take 40 mg by mouth daily.    . metFORMIN (GLUCOPHAGE) 1000 MG tablet Take 1,000 mg by mouth 2 (two) times daily with a meal.    . simvastatin (ZOCOR) 20 MG tablet Take 20 mg by mouth  daily. Take half a tablet daily.    . Vitamin D, Ergocalciferol, (DRISDOL) 50000 UNITS CAPS capsule Take 50,000 Units by mouth every 7 (seven) days.    . warfarin (COUMADIN) 5 MG tablet Take 5 mg by mouth daily. Take 1.5 tablet on Mondays and Wednesday and one tablet on all other days.     No current facility-administered medications for this visit.    Allergies:    Allergies  Allergen Reactions  . Morphine And Related     Social History:  The patient  reports that he has quit smoking. He does not  have any smokeless tobacco history on file. He reports that he does not use illicit drugs.   ROS:  Please see the history of present illness.   Exertional tolerance has improved. Appetite is improved. He is able to lie down without dyspnea. He denies chest pain. No palpitations. No neurological complaints.   All other systems reviewed and negative.   OBJECTIVE: VS:  BP 136/60 mmHg  Pulse 71  Ht 5' 9" (1.753 m)  Wt 231 lb (104.781 kg)  BMI 34.10 kg/m2 Well nourished, well developed, in no acute distress, obese HEENT: normal Neck: JVD flat. Carotid bruit absent  Cardiac:  normal S1, S2; RRR; no murmur Lungs:  clear to auscultation bilaterally, no wheezing, rhonchi or rales Abd: soft, nontender, no hepatomegaly Ext: Edema none. Pulses 2+ and symmetric Skin: warm and dry Neuro:  CNs 2-12 intact, no focal abnormalities noted  EKG:   Atrial fibrillation with rate of 71 bpm.       Signed, Nova Schmuhl W. B. Tennis Mckinnon III, MD 02/14/2014 4:22 PM   

## 2014-02-24 NOTE — Progress Notes (Signed)
Site area: RFA Site Prior to Removal:  Level 0 Pressure Applied For:3025min Manual: yes   Patient Status During Pull:  stable Post Pull Site:  Level 0 Post Pull Instructions Given:  yes Post Pull Pulses Present: palpable Dressing Applied:  clear Bedrest begins @ 0925 Comments:

## 2014-02-24 NOTE — Discharge Instructions (Signed)

## 2014-02-24 NOTE — CV Procedure (Signed)
     Left Heart Catheterization with Coronary and Bypass Angiography Report  Dagoberto ReefJerry W Hlavac  68 y.o.  male 04/29/1945  Procedure Date: 02/24/2014 Referring Physician: Cherrie GauzeH WB Leia AlfSmith, III, M.D. Primary Cardiologist: Same  INDICATIONS: New left ventricular systolic dysfunction with heart failure. In this diabetic we are trying to exclude bypass graft failure.  PROCEDURE: 1. Left heart catheterization; 2. Coronary angiography; 3. Left ventriculography; 4. Past graft angiography  CONSENT:  The risks, benefits, and details of the procedure were explained in detail to the patient. Risks including death, stroke, heart attack, kidney injury, allergy, limb ischemia, bleeding and radiation injury were discussed.  The patient verbalized understanding and wanted to proceed.  Informed written consent was obtained.  PROCEDURE TECHNIQUE:  After Xylocaine anesthesia a 5 French sheath was placed in the right femoral artery with an angiocath and the modified Seldinger technique.  Coronary angiography was done using a 5 F A2 multipurpose and internal mammary artery diagnostic catheter.  Left ventriculography was done using the 5 French straight pigtail catheter and power injection.   I initial plan was to perform right heart catheterization. I was unable to obtain venous access. After noting LV systolic function and filling pressures, we did not persist at attempting right heart measurements.   CONTRAST:  Total of 135 cc.  COMPLICATIONS:  None   HEMODYNAMICS:  Aortic pressure 132/50 mmHg; LV pressure 132/4 mmHg; LVEDP 16 mmHg  ANGIOGRAPHIC DATA:   The left main coronary artery is widely patent.  The left anterior descending artery is diffusely diseased from the ostium to the proximal vessel. The mid vessel contains competitive flow from the internal mammary. The proximal segment is narrowed 70%..  The left circumflex artery is patent. It gives origin to a large first obtuse marginal. The marginal  contains 50-70% ostial and proximal narrowing. There is 70% stenosis beyond the first obtuse marginal. Severe diffuse disease is noted in the fourth obtuse marginal and distally compared, competition with a saphenous vein graft is noted in the fourth obtuse marginal.  The right coronary artery is totally occluded distally, preceded by tandem 70% mid vessel stenoses.Marland Kitchen.  BYPASS GRAFT ANGIOGRAPHY: SVG to OM 4 is widely patent. SVG to diagonal is widely patent SVG to PDA is widely patent Left internal mammary to LAD is widely paten  LEFT VENTRICULOGRAM:  Left ventricular angiogram was done in the 30 RAO projection and revealed global hypokinesis with an ejection fraction of 40%. The left ventricle is moderately dilated.   IMPRESSIONS:  1. Systolic heart failure with dilatation, EF of 40%, and normal filling pressures. 2. Severe native vessel coronary disease with occlusion of the RCA, diffuse proximal LAD disease, and diffuse distal circumflex disease, 3. Widely patent bypass grafts as outlined above 4. The etiology of the patient's heart failure is likely related to atrial arrhythmia with tachycardia mediated LV dysfunction   RECOMMENDATION:  Add carvedilol 6.25 mg twice a day Holter monitor to determine if adequate rate control is present.

## 2014-03-15 ENCOUNTER — Telehealth: Payer: Self-pay | Admitting: Interventional Cardiology

## 2014-03-15 ENCOUNTER — Ambulatory Visit: Payer: Medicare Other | Admitting: Interventional Cardiology

## 2014-03-15 NOTE — Telephone Encounter (Signed)
Walk in pt form " pt needs ref[facility] mailed l" [facility]

## 2014-03-21 ENCOUNTER — Other Ambulatory Visit: Payer: Self-pay

## 2014-03-22 ENCOUNTER — Other Ambulatory Visit: Payer: Self-pay

## 2014-03-22 ENCOUNTER — Telehealth: Payer: Self-pay | Admitting: Interventional Cardiology

## 2014-03-22 MED ORDER — CARVEDILOL 6.25 MG PO TABS: 6.2500 mg | ORAL_TABLET | Freq: Two times a day (BID) | ORAL | Status: AC

## 2014-03-22 NOTE — Telephone Encounter (Signed)
New message    Wife calling     Written prescription - beta blocker. This is a new prescription that was prescribe in dec.    Need a letter of explanation regarding his previous testing.

## 2014-03-22 NOTE — Telephone Encounter (Signed)
Verbal Permission from patient to speak to his wife. They are requesting a written Rx for Carvedilol 6.25mg  bid  [facility] Also, requesting medical records re: his recent cath to [facility] and keep the MD's there informed.

## 2014-04-10 ENCOUNTER — Ambulatory Visit (HOSPITAL_BASED_OUTPATIENT_CLINIC_OR_DEPARTMENT_OTHER): Payer: Medicare Other | Attending: Cardiology

## 2014-04-10 VITALS — Ht 69.0 in | Wt 235.0 lb

## 2014-04-10 DIAGNOSIS — R001 Bradycardia, unspecified: Secondary | ICD-10-CM | POA: Insufficient documentation

## 2014-04-10 DIAGNOSIS — Z9989 Dependence on other enabling machines and devices: Secondary | ICD-10-CM

## 2014-04-10 DIAGNOSIS — R0683 Snoring: Secondary | ICD-10-CM | POA: Diagnosis not present

## 2014-04-10 DIAGNOSIS — G4733 Obstructive sleep apnea (adult) (pediatric): Secondary | ICD-10-CM | POA: Diagnosis not present

## 2014-04-19 ENCOUNTER — Telehealth: Payer: Self-pay | Admitting: Cardiology

## 2014-04-19 ENCOUNTER — Encounter (HOSPITAL_BASED_OUTPATIENT_CLINIC_OR_DEPARTMENT_OTHER): Payer: Self-pay

## 2014-04-19 DIAGNOSIS — G4733 Obstructive sleep apnea (adult) (pediatric): Secondary | ICD-10-CM | POA: Insufficient documentation

## 2014-04-19 NOTE — Sleep Study (Addendum)
   NAME: [name] DATE OF BIRTH:  1945-07-16 MEDICAL RECORD NUMBER [id number]  LOCATION: [facility]  PHYSICIAN: Rivers Hamrick R  DATE OF STUDY: 04/10/2014  SLEEP STUDY TYPE: Split night Nocturnal Polysomnogram with CPAP titration               REFERRING PHYSICIAN: Smith, Henry,MD  INDICATION FOR STUDY: snoring, excessive daytime sleepiness  EPWORTH SLEEPINESS SCORE: 12 HEIGHT: 5\' 9"  (175.3 cm)  WEIGHT: 235 lb (106.595 kg)    Body mass index is 34.69 kg/(m^2).  NECK SIZE: 17.5 in.  MEDICATIONS: Reviewed in the chart  SLEEP ARCHITECTURE: During the diagnostic portion of the study, the patient slept for a total of 142 minutes with no slow wave sleep and 36 minutes of REM sleep.  The sleep onset latency was 17 minutes and onset to REM sleep latency was 80 minutes.  The sleep efficiency was reduced at 72%.  During the CPAP titration, the patient slept for a total of 182 minutes with no slow wave sleep and 77 minutes of REM sleep.  The onset to sleep latency was 24 minutes and onset to REM sleep latency was 64 minutes.  The sleep efficiency was reduced at 80%.    RESPIRATORY DATA: During the diagnostic portion of the study, there were a total of 2 central apneas and 45 obstructive hypopneas for a total AHI of 20 events per hour consistent with moderate obstructive sleep apnea/hypopnea syndrome.  Most events occurred during REM sleep in the supine position.  There was moderate snoring noted.  The patient was started on CPAP at 5cm H2O and titrated for respiratory events and snoring to 15cm H2O.  The patient's AHI at 15cm H2O was 30 events per hour.  The patient was not able to reach optimum pressure and maintain the supine position in REM sleep without further respiratory events.  OXYGEN DATA: The lowest oxygen saturation during the diagnostic portion of the study was 80% in REM sleep and 81% in NREM sleep.  The total time spent with O2 saturations <88% was 3.4 minutes.   During the CPAP titration, the lowest O2 saturation was 76% in REM sleep and 84% in NREM sleep.  The total time spent with O2 saturations <88% was 11 minutes.  CARDIAC DATA: The patient maintained atrial flutter throughout the study with average HR 62-66bpm.  MOVEMENT/PARASOMNIA: There were no periodic limb movements or REM sleep behavior disorders noted during the study.  IMPRESSION/ RECOMMENDATION:   1.  Moderate obstructive sleep apnea/hypopnea syndrome with AHI 20 events per hour.  Most events occurred during REM supine sleep. 2.  Reduced sleep efficiency with increased frequency of arousals due to respiratory events.   3.  No periodic limb movement disorders noted.   4.  Moderate snoring was noted. 5.  The patient was in atrial fibrillation during the study with average HR 62-66bpm. 6.  The patient was tried but unsuccessful at CPAP titration due to continued respiratory events and oxygen desaturation.  Therefore, recommend repeat study with dedicated CPAP/BiPAP titration.  Signed:  Quintella ReichertURNER,Daziah Hesler R Diplomate, American Board of Sleep Medicine  ELECTRONICALLY SIGNED ON:  04/19/2014, 12:55 PM [facility] PH: [phone]   FX: [phone] ACCREDITED BY THE AMERICAN ACADEMY OF SLEEP MEDICINE

## 2014-04-19 NOTE — Telephone Encounter (Signed)
Please let patient know that he has moderate OSA but could not adequately be titrated on CPAP the same night.  Please set up for full night CPAP titration in the lab.

## 2014-04-20 NOTE — Addendum Note (Signed)
Addended by: Gunnar FusiKEMP, KATHRYN A on: 04/20/2014 11:22 AM   Modules accepted: Orders

## 2014-04-20 NOTE — Telephone Encounter (Signed)
Patient informed of sleep study results and verbal understanding expressed.  CPAP titration ordered for scheduling.  Patient agrees with treatment plan.  

## 2014-05-10 ENCOUNTER — Encounter: Payer: Self-pay | Admitting: Interventional Cardiology

## 2014-05-10 ENCOUNTER — Ambulatory Visit (INDEPENDENT_AMBULATORY_CARE_PROVIDER_SITE_OTHER): Payer: Medicare Other | Admitting: Interventional Cardiology

## 2014-05-10 VITALS — BP 140/62 | HR 68 | Ht 69.0 in | Wt 243.8 lb

## 2014-05-10 DIAGNOSIS — I25810 Atherosclerosis of coronary artery bypass graft(s) without angina pectoris: Secondary | ICD-10-CM

## 2014-05-10 DIAGNOSIS — I5022 Chronic systolic (congestive) heart failure: Secondary | ICD-10-CM

## 2014-05-10 DIAGNOSIS — I480 Paroxysmal atrial fibrillation: Secondary | ICD-10-CM

## 2014-05-10 DIAGNOSIS — I10 Essential (primary) hypertension: Secondary | ICD-10-CM

## 2014-05-10 MED ORDER — SPIRONOLACTONE 25 MG PO TABS: 25.0000 mg | ORAL_TABLET | Freq: Every day | ORAL | Status: DC

## 2014-05-10 NOTE — Progress Notes (Signed)
Cardiology Office Note   Date:  05/10/2014   ID:  [name], DOB [birth year], MRN [id number]  PCP:  Georgann HousekeeperHUSAIN,KARRAR, MD  Cardiologist:   Lesleigh NoeSMITH III,HENRY W, MD   Chief Complaint  Patient presents with  . Coronary Artery Disease      History of Present Illness: [name] is a 69 y.o. male who presents for follow-up of systolic heart failure recently diagnosed, atrial fibrillation of unknown duration, and obstructive sleep apnea recently diagnosed after the patient developed heart failure. He has lower extremity swelling and exertional dyspnea. The dyspnea is his major limiting complaint. He denies angina, and at cath had widely patent bypass grafts.    Past Medical History  Diagnosis Date  . Diabetes mellitus without complication     type 2   . Hyperlipidemia   . Hypertension   . Arrhythmia     paroxysmal benign(monitoring, PT)  . Colon polyp     2004, 2007, 2011-repat in 2016   . CAD (coronary artery disease)     (MI x 3).post CABG  . CAD (coronary artery disease)     [facility]  . PAF (paroxysmal atrial fibrillation) 01/13/2014  . Coronary artery disease involving other coronary artery bypass graft without angina pectoris 01/13/2014  . Essential hypertension 01/13/2014  . Type 2 diabetes, uncontrolled, with neuropathy 01/13/2014  . Morbid obesity 01/13/2014  . OSA (obstructive sleep apnea)     moderate with AHI 20/hr    Past Surgical History  Procedure Laterality Date  . Left and right heart catheterization with coronary/graft angiogram N/A 02/24/2014    Procedure: LEFT AND RIGHT HEART CATHETERIZATION WITH Isabel CapriceORONARY/GRAFT ANGIOGRAM;  Surgeon: Lesleigh NoeHenry W Smith III, MD;  [facility];  Service: Cardiovascular;  Laterality: N/A;     Current Outpatient Prescriptions  Medication Sig Dispense Refill  . amLODipine (NORVASC) 10 MG tablet Take 10 mg by mouth daily.    . carvedilol (COREG) 6.25 MG tablet Take 1 tablet (6.25 mg  total) by mouth 2 (two) times daily with a meal. 180 tablet 3  . furosemide (LASIX) 40 MG tablet Take 1 tablet (40 mg total) by mouth daily. 30 tablet 11  . insulin glargine (LANTUS) 100 UNIT/ML injection Inject 70-74 Units into the skin at bedtime.    . insulin lispro (HUMALOG KWIKPEN) 100 UNIT/ML KiwkPen Inject 8-10 Units into the skin daily.    Marland Kitchen. lisinopril (PRINIVIL,ZESTRIL) 40 MG tablet Take 40 mg by mouth daily.    . metFORMIN (GLUCOPHAGE) 1000 MG tablet Take 1,000 mg by mouth 2 (two) times daily with a meal.    . simvastatin (ZOCOR) 80 MG tablet Take 40 mg by mouth daily.    . Vitamin D, Ergocalciferol, (DRISDOL) 50000 UNITS CAPS capsule Take 50,000 Units by mouth every Sunday.     . warfarin (COUMADIN) 5 MG tablet Take 5 mg by mouth daily. Take 1.5 tablet on Wednesday and one tablet on all other days.    Marland Kitchen. spironolactone (ALDACTONE) 25 MG tablet Take 1 tablet (25 mg total) by mouth daily. 30 tablet 11   No current facility-administered medications for this visit.    Allergies:   Morphine and related    Social History:  The patient  reports that he has quit smoking. He does not have any smokeless tobacco history on file. He reports that he does not use illicit drugs.   Family History:  The patient's family history is not on file.  ROS:  Please see the history of present illness.   Otherwise, review of systems are positive for has a go back for a C Pap she and has decreased energy especially early in the morning.  All other systems are reviewed and negative.    PHYSICAL EXAM: VS:  BP 140/62 mmHg  Pulse 68  Ht  (1.753 m)  Wt 243 lb 12.8 oz (110.587 kg)  BMI 35.99 kg/m2 , BMI Body mass index is 35.99 kg/(m^2). GEN: Well nourished, well developed, in no acute distress HEENT: normal Neck: no JVD, carotid bruits, or masses Cardiac: Irregularly irregular RR; no murmurs, rubs, or gallops,no edema  Respiratory:  clear to auscultation bilaterally, normal work of breathing GI:  soft, nontender, nondistended, + BS MS: no deformity or atrophy Skin: warm and dry, no rash Neuro:  Strength and sensation are intact Psych: euthymic mood, full affect   EKG:  EKG is not ordered today.    Recent Labs: 02/18/2014: BUN 22; Creatinine 1.0; Hemoglobin 12.8*; Platelets 198.0; Potassium 4.4; Sodium 139    Lipid Panel No results found for: CHOL, TRIG, HDL, CHOLHDL, VLDL, LDLCALC, LDLDIRECT    Wt Readings from Last 3 Encounters:  05/10/14 243 lb 12.8 oz (110.587 kg)  04/10/14 235 lb (106.595 kg)  02/14/14 231 lb (104.781 kg)      Other studies Reviewed: Additional studies/ records that were reviewed today include:.    ASSESSMENT AND PLAN:  Coronary artery disease involving other coronary artery bypass graft without angina pectoris: Asymptomatic  PAF (paroxysmal atrial fibrillation): Excellent rate control  Essential hypertension: Borderline systolic control  Chronic systolic heart failure: Still with dyspnea on exertion. Add Aldactone 25 mg per day     Current medicines are reviewed at length with the patient today.  The patient does not have concerns regarding medicines.  The following changes have been made:  Add Aldactone 25 mg per day. We will complete agent orange forms for the patient ML back to them.  Labs/ tests ordered today include:   Orders Placed This Encounter  Procedures  . Basic metabolic panel     Disposition:   FU with Mendel Ryder in 3 months   Signed, Lesleigh Noe, MD  05/10/2014 5:02 PM    [facility] [address] [place], [place], [place]  [address] Phone: [phone]; Fax: [phone]

## 2014-05-10 NOTE — Patient Instructions (Signed)
Your physician has recommended you make the following change in your medication:  1) START Spironolactone 25mg  daily. An Rx has been sent to your pharmacy  Your physician recommends that you return for lab work in: 2-4 weeks ([facility])  Your physician recommends that you schedule a follow-up appointment in: 3 months   Low-Sodium Eating Plan Sodium raises blood pressure and causes water to be held in the body. Getting less sodium from food will help lower your blood pressure, reduce any swelling, and protect your heart, liver, and kidneys. We get sodium by adding salt (sodium chloride) to food. Most of our sodium comes from canned, boxed, and frozen foods. Restaurant foods, fast foods, and pizza are also very high in sodium. Even if you take medicine to lower your blood pressure or to reduce fluid in your body, getting less sodium from your food is important. WHAT IS MY PLAN? Most people should limit their sodium intake to 2,300 mg a day. Your health care provider recommends that you limit your sodium intake to __________ a day.  WHAT DO I NEED TO KNOW ABOUT THIS EATING PLAN? For the low-sodium eating plan, you will follow these general guidelines:  Choose foods with a % Daily Value for sodium of less than 5% (as listed on the food label).   Use salt-free seasonings or herbs instead of table salt or sea salt.   Check with your health care provider or pharmacist before using salt substitutes.   Eat fresh foods.  Eat more vegetables and fruits.  Limit canned vegetables. If you do use them, rinse them well to decrease the sodium.   Limit cheese to 1 oz (28 g) per day.   Eat lower-sodium products, often labeled as "lower sodium" or "no salt added."  Avoid foods that contain monosodium glutamate (MSG). MSG is sometimes added to [place] food and some canned foods.  Check food labels (Nutrition Facts labels) on foods to learn how much sodium is in one serving.  Eat more home-cooked  food and less restaurant, buffet, and fast food.  When eating at a restaurant, ask that your food be prepared with less salt or none, if possible.  HOW DO I READ FOOD LABELS FOR SODIUM INFORMATION? The Nutrition Facts label lists the amount of sodium in one serving of the food. If you eat more than one serving, you must multiply the listed amount of sodium by the number of servings. Food labels may also identify foods as:  Sodium free--Less than 5 mg in a serving.  Very low sodium--35 mg or less in a serving.  Low sodium--140 mg or less in a serving.  Light in sodium--50% less sodium in a serving. For example, if a food that usually has 300 mg of sodium is changed to become light in sodium, it will have 150 mg of sodium.  Reduced sodium--25% less sodium in a serving. For example, if a food that usually has 400 mg of sodium is changed to reduced sodium, it will have 300 mg of sodium. WHAT FOODS CAN I EAT? Grains Low-sodium cereals, including oats, puffed wheat and rice, and shredded wheat cereals. Low-sodium crackers. Unsalted rice and pasta. Lower-sodium bread.  Vegetables Frozen or fresh vegetables. Low-sodium or reduced-sodium canned vegetables. Low-sodium or reduced-sodium tomato sauce and paste. Low-sodium or reduced-sodium tomato and vegetable juices.  Fruits Fresh, frozen, and canned fruit. Fruit juice.  Meat and Other Protein Products Low-sodium canned tuna and salmon. Fresh or frozen meat, poultry, seafood, and fish. Lamb. Unsalted  nuts. Dried beans, peas, and lentils without added salt. Unsalted canned beans. Homemade soups without salt. Eggs.  Dairy Milk. Soy milk. Ricotta cheese. Low-sodium or reduced-sodium cheeses. Yogurt.  Condiments Fresh and dried herbs and spices. Salt-free seasonings. Onion and garlic powders. Low-sodium varieties of mustard and ketchup. Lemon juice.  Fats and Oils Reduced-sodium salad dressings. Unsalted butter.  Other Unsalted popcorn  and pretzels.  The items listed above may not be a complete list of recommended foods or beverages. Contact your dietitian for more options. WHAT FOODS ARE NOT RECOMMENDED? Grains Instant hot cereals. Bread stuffing, pancake, and biscuit mixes. Croutons. Seasoned rice or pasta mixes. Noodle soup cups. Boxed or frozen macaroni and cheese. Self-rising flour. Regular salted crackers. Vegetables Regular canned vegetables. Regular canned tomato sauce and paste. Regular tomato and vegetable juices. Frozen vegetables in sauces. Salted french fries. Olives. Rosita Fire. Relishes. Sauerkraut. Salsa. Meat and Other Protein Products Salted, canned, smoked, spiced, or pickled meats, seafood, or fish. Bacon, ham, sausage, hot dogs, corned beef, chipped beef, and packaged luncheon meats. Salt pork. Jerky. Pickled herring. Anchovies, regular canned tuna, and sardines. Salted nuts. Dairy Processed cheese and cheese spreads. Cheese curds. Blue cheese and cottage cheese. Buttermilk.  Condiments Onion and garlic salt, seasoned salt, table salt, and sea salt. Canned and packaged gravies. Worcestershire sauce. Tartar sauce. Barbecue sauce. Teriyaki sauce. Soy sauce, including reduced sodium. Steak sauce. Fish sauce. Oyster sauce. Cocktail sauce. Horseradish. Regular ketchup and mustard. Meat flavorings and tenderizers. Bouillon cubes. Hot sauce. Tabasco sauce. Marinades. Taco seasonings. Relishes. Fats and Oils Regular salad dressings. Salted butter. Margarine. Ghee. Bacon fat.  Other Potato and tortilla chips. Corn chips and puffs. Salted popcorn and pretzels. Canned or dried soups. Pizza. Frozen entrees and pot pies.  The items listed above may not be a complete list of foods and beverages to avoid. Contact your dietitian for more information. Document Released: 08/17/2001 Document Revised: 03/02/2013 Document Reviewed: 12/30/2012 [facility] Patient Information 2015 [place], [place]. This information is not  intended to replace advice given to you by your health care provider. Make sure you discuss any questions you have with your health care provider.

## 2014-05-17 ENCOUNTER — Telehealth: Payer: Self-pay | Admitting: Interventional Cardiology

## 2014-05-17 DIAGNOSIS — I5022 Chronic systolic (congestive) heart failure: Secondary | ICD-10-CM

## 2014-05-17 NOTE — Telephone Encounter (Signed)
Pt's wife requesting written rx for [place]act[place] so [facility]

## 2014-05-18 MED ORDER — SPIRONOLACTONE 25 MG PO TABS: 25.0000 mg | ORAL_TABLET | Freq: Every day | ORAL | Status: AC

## 2014-05-18 NOTE — Telephone Encounter (Signed)
pt wife aware [employer] disability form has been completed and signed by Dr.Smith. pt wife rqst it be mailed to their home.done

## 2014-05-18 NOTE — Telephone Encounter (Signed)
Pt rqst written Rx for Spironolactone, so that he can take it to [facility] to be filled. He rqst Rx be mailed to his home. done

## 2014-05-24 ENCOUNTER — Other Ambulatory Visit (INDEPENDENT_AMBULATORY_CARE_PROVIDER_SITE_OTHER): Payer: Medicare Other | Admitting: *Deleted

## 2014-05-24 DIAGNOSIS — I5022 Chronic systolic (congestive) heart failure: Secondary | ICD-10-CM | POA: Diagnosis not present

## 2014-05-24 LAB — BASIC METABOLIC PANEL
BUN: 20 mg/dL (ref 6–23)
CO2: 34 meq/L — AB (ref 19–32)
CREATININE: 0.91 mg/dL (ref 0.40–1.50)
Calcium: 9.5 mg/dL (ref 8.4–10.5)
Chloride: 102 mEq/L (ref 96–112)
GFR: 87.41 mL/min (ref >60.00)
Glucose, Bld: 98 mg/dL (ref 70–99)
Potassium: 4 mEq/L (ref 3.5–5.1)
Sodium: 139 mEq/L (ref 135–145)

## 2014-05-25 ENCOUNTER — Telehealth: Payer: Self-pay

## 2014-05-25 NOTE — Telephone Encounter (Signed)
pt aware of lab results.Labs are normal. No change needed.pt verbalized understanding.

## 2014-05-25 NOTE — Telephone Encounter (Signed)
-----   Message from Lyn RecordsHenry W Smith, MD sent at 05/24/2014  7:28 PM EDT ----- Labs are normal. No change needed.

## 2014-06-27 ENCOUNTER — Ambulatory Visit (HOSPITAL_BASED_OUTPATIENT_CLINIC_OR_DEPARTMENT_OTHER): Payer: Medicare Other | Attending: Cardiology

## 2014-06-27 VITALS — Ht 69.0 in | Wt 240.0 lb

## 2014-06-27 DIAGNOSIS — I4892 Unspecified atrial flutter: Secondary | ICD-10-CM | POA: Insufficient documentation

## 2014-06-27 DIAGNOSIS — G4733 Obstructive sleep apnea (adult) (pediatric): Secondary | ICD-10-CM | POA: Diagnosis present

## 2014-06-27 DIAGNOSIS — R0683 Snoring: Secondary | ICD-10-CM | POA: Insufficient documentation

## 2014-06-30 ENCOUNTER — Telehealth: Payer: Self-pay | Admitting: Interventional Cardiology

## 2014-06-30 NOTE — Telephone Encounter (Signed)
Pt is seen by Dr.Turner for sleep. Message routed to her nurse Karsten FellsKaty Kemp, RN to address.

## 2014-06-30 NOTE — Telephone Encounter (Signed)
New message   Pt called requests a call back to discuss sleep study results and Where to pick up sleep study machine

## 2014-07-01 ENCOUNTER — Telehealth: Payer: Self-pay | Admitting: Cardiology

## 2014-07-01 NOTE — Sleep Study (Addendum)
   NAME: [name] DATE OF BIRTH:  1946/02/28 MEDICAL RECORD NUMBER [id number]  LOCATION: [facility]  PHYSICIAN: Chloie Loney R  DATE OF STUDY: 06/27/2014  SLEEP STUDY TYPE: Positive Airway Pressure Titration               REFERRING PHYSICIAN: Quintella Reicherturner, Christianne Zacher R, MD  INDICATION FOR STUDY: Obstructive Sleep Apnea  EPWORTH SLEEPINESS SCORE: 12 HEIGHT: 5\' 9"  (175.3 cm)  WEIGHT: 240 lb (108.863 kg)    Body mass index is 35.43 kg/(m^2).  NECK SIZE: 17.5 in.  MEDICATIONS: Reviewed in the chart  SLEEP ARCHITECTURE: The patient slept for a total of 374 minutes out of a total sleep period of 442 minutes.  There was no slow wave sleep and 78 minutes of REM sleep.  The onset to sleep latency was normal at 7.5 minutes and the onset to REM sleep latency was normal at 105 minutes.  The sleep efficiency was reduced at 76%.  There was an increased in spontaneous arousals during the study.    RESPIRATORY DATA: The patient was started at 5cm H2O and titrated for respiratory events and snoring to 19cm H2O.  The patient was able to maintain REM supine sleep for a prolonged period of time at 15cm H2O without any further respiratory events.  The AHI was 3 events per hour at 15cm H2O.    OXYGEN DATA: The average oxygen saturation was 92%.  The lowest oxygen saturation was 78%.  The time spent with oxygen saturations <88% was 46 minutes.  CARDIAC DATA: The patient was in atrial flutter during the study with an average heart rate of 69 bpm.  The lowest heart rate was 30 bpm and the highest heart rate was 181 bpm.  There were occasional PVC's.    MOVEMENT/PARASOMNIA: There were no periodic limb movement disorders or REM sleep behavior disorders.   IMPRESSION/ RECOMMENDATION:   1.   Moderate Obstructive Sleep Apnea/Hypopnea syndrome with an AHI of 20 events per hour.   2.  Successful CPAP titration to 15cm H2O.   The patient was able to maintain REM supine sleep for a prolonged period  of time at 15cm H2O without any further respiratory events. 3.  Mild snoring was noted during the titration. 4.  The patient was in atrial flutter with average heart rate 69bpm. 5.  Reduced sleep efficiency with increased frequency of spontaneous arousals. 6.  Recommend ResMed CPAP at 15cm H2O with heated humidifier, medium [employer] Simplus Full Face Mask with ah EPR of 3. 7.  Given significant Oxygen desaturations that persisted  Despite adequate CPAP titration with O2 saturations <88% for over 46 minutes, recommend overnight pulse oximetry on CPAP.    Signed: Quintella ReichertURNER,Kenden Brandt R Diplomate, American Board of Sleep Medicine  ELECTRONICALLY SIGNED ON:  07/01/2014, 9:50 PM [facility] PH: [phone]   FX: [phone] ACCREDITED BY THE AMERICAN ACADEMY OF SLEEP MEDICINE

## 2014-07-01 NOTE — Telephone Encounter (Signed)
Pt had successful PAP titration. Please setup appointment in 10 weeks. Please let [facility] know that order for PAP is in [facility].   

## 2014-07-01 NOTE — Telephone Encounter (Signed)
Please get an overnight pulse oximetry on CPAP

## 2014-07-01 NOTE — Addendum Note (Signed)
Addended by: Armanda MagicURNER, TRACI R on: 07/01/2014 10:07 PM   Modules accepted: Orders

## 2014-07-04 NOTE — Telephone Encounter (Addendum)
[facility] can get pt the machine if [facility] [place] [facility] [employer] on 07/05/14. Pt has agreed to this, and has been given the information to pick it up.   They cannot do the Overnight Pulse Ox before he leaves town, so I have asked Dr. Mayford Knifeurner what she would like to do about that.

## 2014-07-04 NOTE — Telephone Encounter (Signed)
See other phone note

## 2014-07-04 NOTE — Telephone Encounter (Signed)
Go ahead and see if we can get it done tonight so we can get it set uip

## 2014-07-04 NOTE — Telephone Encounter (Signed)
Per Patient request, I have given the address of [facility] [place] to his wife Lupita LeashDonna.  [facility] has been notified of orders as well.

## 2014-07-04 NOTE — Telephone Encounter (Signed)
Called pt to give him information, and [place] and will need to find a Dr there to follow this. I have forwarded this message to Dr Mayford Knifeurner to see if she still wants to proceed or if she wants to wait until he [place] established [place].

## 2014-07-04 NOTE — Telephone Encounter (Signed)
Needs to see PCP in [place] to get overnight pulse oximetry on CPAP

## 2014-08-05 ENCOUNTER — Encounter: Payer: Self-pay | Admitting: Cardiology

## 2014-08-15 ENCOUNTER — Ambulatory Visit: Payer: Medicare Other | Admitting: Interventional Cardiology

## 2015-02-04 IMAGING — CR DG CHEST 2V
2 series · 2 of 2 positions shown · non-contrast
Comparison: 09/03/2012.

CLINICAL DATA: Shortness of breath.

EXAM:
CHEST  2 VIEW

[view not recorded (1 of 2)]
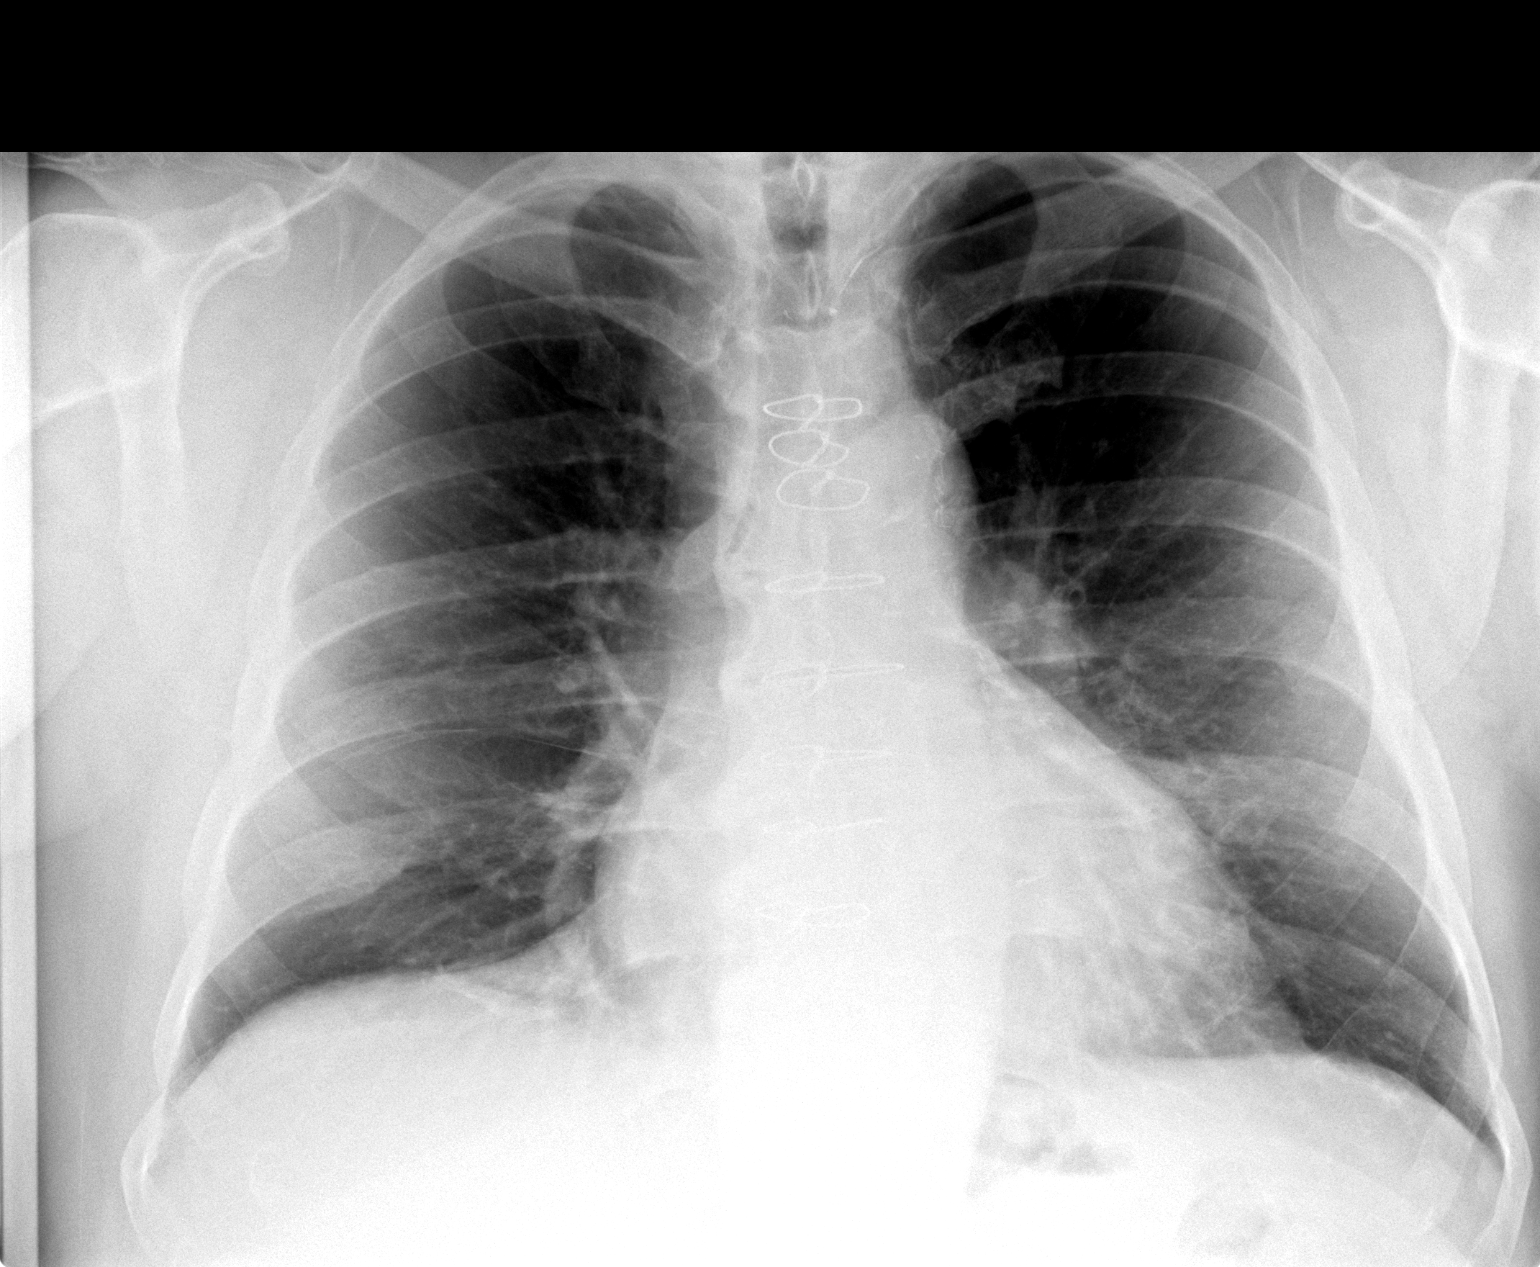

[view not recorded (2 of 2)]
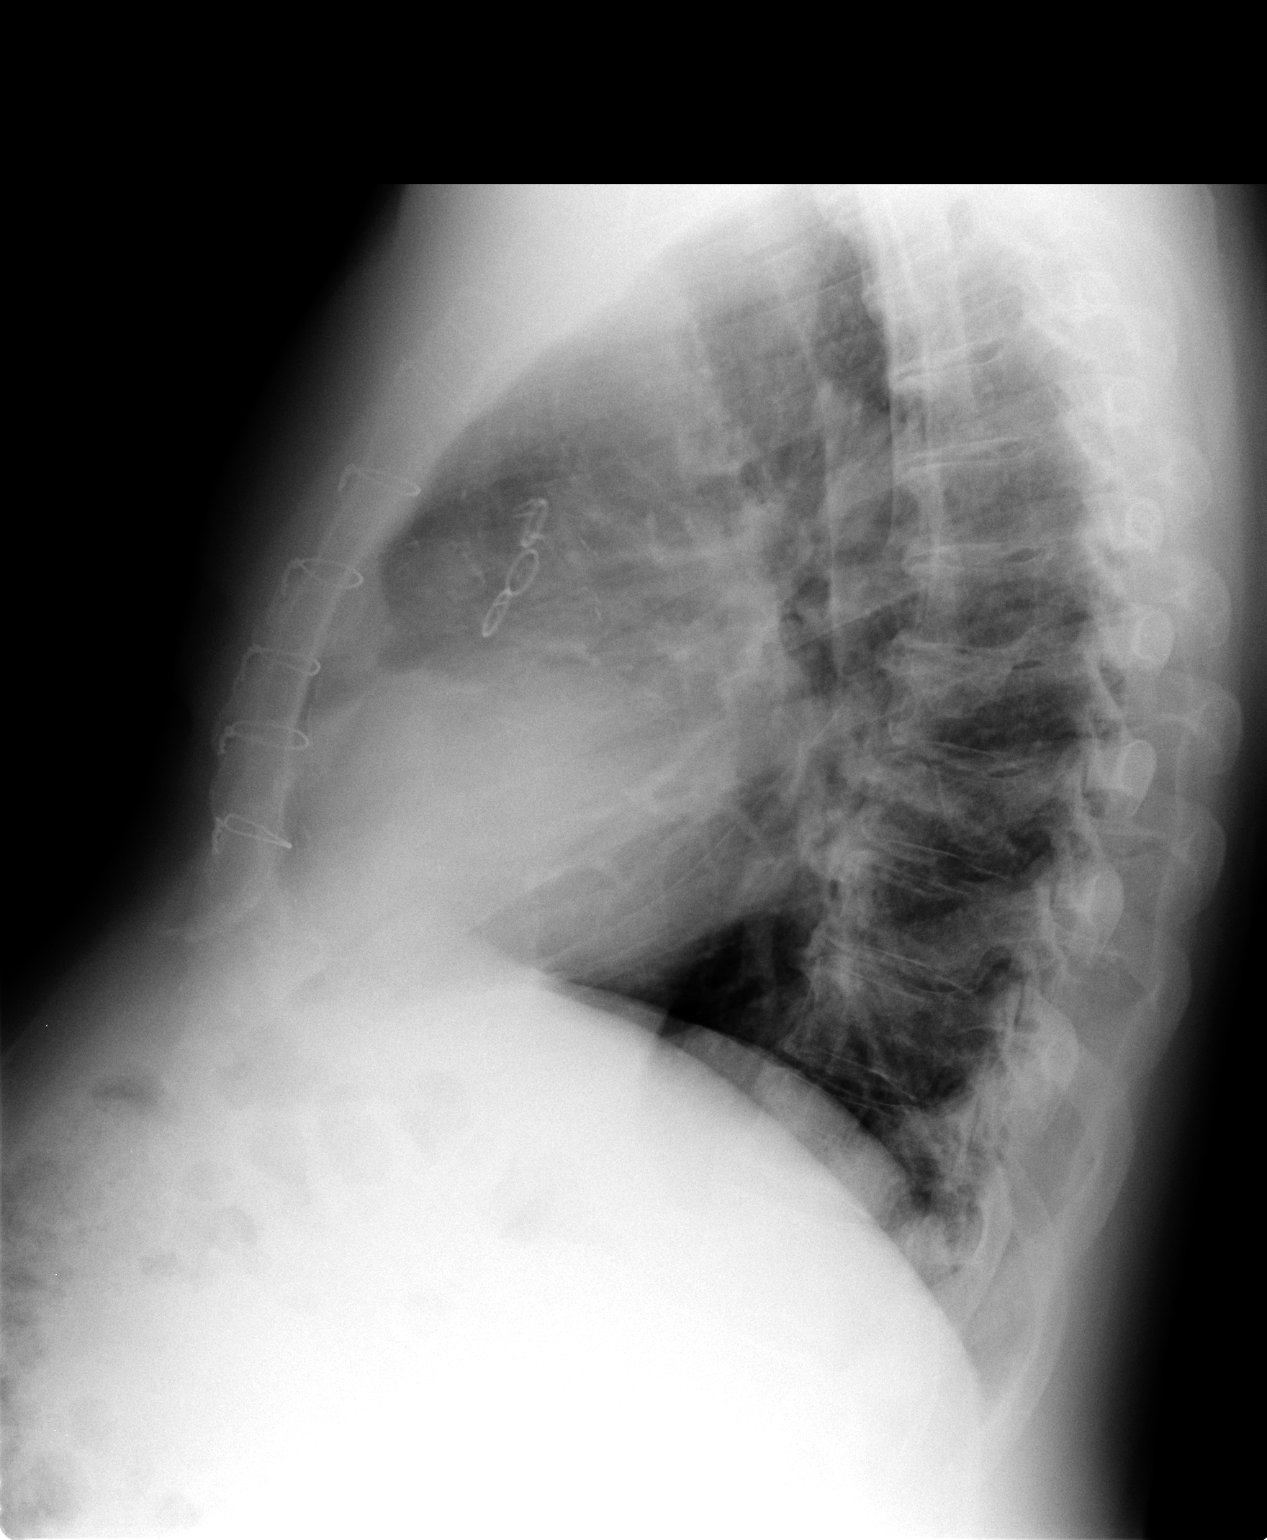

[2 of 2 positions shown; findings below may reference images not displayed]

FINDINGS: Mediastinum and hilar structures are normal. Cardiomegaly. Prior
CABG. Mild basilar atelectasis. No focal alveolar infiltrate. Prior
cervical spine fusion.
IMPRESSION: 1.  Bibasilar atelectasis.  No focal pulmonary infiltrate.

2.  Prior CABG.  Cardiomegaly.  No CHF

## 2018-08-10 DEATH — deceased
